# Patient Record
Sex: Female | Born: 1935 | Race: White | Hispanic: No | Smoking: Former smoker
Health system: Southern US, Community
[De-identification: ages and names within clinical notes are randomized; demographics above are authoritative.]

## PROBLEM LIST (undated history)

## (undated) DIAGNOSIS — J101 Influenza due to other identified influenza virus with other respiratory manifestations: Secondary | ICD-10-CM

## (undated) DIAGNOSIS — I10 Essential (primary) hypertension: Secondary | ICD-10-CM

## (undated) DIAGNOSIS — I639 Cerebral infarction, unspecified: Secondary | ICD-10-CM

## (undated) DIAGNOSIS — G20A1 Parkinson's disease without dyskinesia, without mention of fluctuations: Secondary | ICD-10-CM

## (undated) DIAGNOSIS — G20 Parkinson's disease: Secondary | ICD-10-CM

## (undated) DIAGNOSIS — E079 Disorder of thyroid, unspecified: Secondary | ICD-10-CM

## (undated) DIAGNOSIS — J449 Chronic obstructive pulmonary disease, unspecified: Secondary | ICD-10-CM

## (undated) DIAGNOSIS — E7800 Pure hypercholesterolemia, unspecified: Secondary | ICD-10-CM

## (undated) HISTORY — PX: CHOLECYSTECTOMY: SHX55

## (undated) HISTORY — PX: BACK SURGERY: SHX140

## (undated) HISTORY — PX: OTHER SURGICAL HISTORY: SHX169

---

## 2010-03-07 ENCOUNTER — Emergency Department (HOSPITAL_COMMUNITY): Admission: EM | Admit: 2010-03-07 | Discharge: 2010-03-07 | Payer: Self-pay | Admitting: Emergency Medicine

## 2015-08-26 ENCOUNTER — Encounter (HOSPITAL_COMMUNITY): Payer: Self-pay | Admitting: Emergency Medicine

## 2015-08-26 ENCOUNTER — Emergency Department (HOSPITAL_COMMUNITY)
Admission: EM | Admit: 2015-08-26 | Discharge: 2015-08-26 | Disposition: A | Discharge status: Home / Self Care | Payer: Medicare Other | Attending: Emergency Medicine | Admitting: Emergency Medicine

## 2015-08-26 DIAGNOSIS — Z7951 Long term (current) use of inhaled steroids: Secondary | ICD-10-CM | POA: Insufficient documentation

## 2015-08-26 DIAGNOSIS — L539 Erythematous condition, unspecified: Secondary | ICD-10-CM | POA: Insufficient documentation

## 2015-08-26 DIAGNOSIS — Z79899 Other long term (current) drug therapy: Secondary | ICD-10-CM | POA: Diagnosis not present

## 2015-08-26 DIAGNOSIS — E7800 Pure hypercholesterolemia, unspecified: Secondary | ICD-10-CM | POA: Insufficient documentation

## 2015-08-26 DIAGNOSIS — Z792 Long term (current) use of antibiotics: Secondary | ICD-10-CM | POA: Diagnosis not present

## 2015-08-26 DIAGNOSIS — N390 Urinary tract infection, site not specified: Secondary | ICD-10-CM | POA: Diagnosis not present

## 2015-08-26 DIAGNOSIS — G20 Parkinson's disease: Secondary | ICD-10-CM | POA: Diagnosis not present

## 2015-08-26 DIAGNOSIS — I10 Essential (primary) hypertension: Secondary | ICD-10-CM | POA: Diagnosis not present

## 2015-08-26 DIAGNOSIS — Z7982 Long term (current) use of aspirin: Secondary | ICD-10-CM | POA: Insufficient documentation

## 2015-08-26 DIAGNOSIS — E079 Disorder of thyroid, unspecified: Secondary | ICD-10-CM | POA: Insufficient documentation

## 2015-08-26 DIAGNOSIS — Z9889 Other specified postprocedural states: Secondary | ICD-10-CM | POA: Insufficient documentation

## 2015-08-26 DIAGNOSIS — Z8673 Personal history of transient ischemic attack (TIA), and cerebral infarction without residual deficits: Secondary | ICD-10-CM | POA: Diagnosis not present

## 2015-08-26 DIAGNOSIS — Z87891 Personal history of nicotine dependence: Secondary | ICD-10-CM | POA: Insufficient documentation

## 2015-08-26 DIAGNOSIS — R300 Dysuria: Secondary | ICD-10-CM | POA: Diagnosis present

## 2015-08-26 HISTORY — DX: Essential (primary) hypertension: I10

## 2015-08-26 HISTORY — DX: Cerebral infarction, unspecified: I63.9

## 2015-08-26 HISTORY — DX: Pure hypercholesterolemia, unspecified: E78.00

## 2015-08-26 HISTORY — DX: Parkinson's disease: G20

## 2015-08-26 HISTORY — DX: Disorder of thyroid, unspecified: E07.9

## 2015-08-26 HISTORY — DX: Parkinson's disease without dyskinesia, without mention of fluctuations: G20.A1

## 2015-08-26 LAB — BASIC METABOLIC PANEL
ANION GAP: 7 (ref 5–15)
BUN: 19 mg/dL (ref 6–20)
CALCIUM: 9.8 mg/dL (ref 8.9–10.3)
CO2: 26 mmol/L (ref 22–32)
CREATININE: 0.94 mg/dL (ref 0.44–1.00)
Chloride: 109 mmol/L (ref 101–111)
GFR, EST NON AFRICAN AMERICAN: 53 mL/min — AB (ref >60)
Glucose, Bld: 112 mg/dL — ABNORMAL HIGH (ref 65–99)
Potassium: 4 mmol/L (ref 3.5–5.1)
SODIUM: 142 mmol/L (ref 135–145)

## 2015-08-26 LAB — URINALYSIS, ROUTINE W REFLEX MICROSCOPIC
BILIRUBIN URINE: NEGATIVE
GLUCOSE, UA: NEGATIVE mg/dL
NITRITE: NEGATIVE
PROTEIN: 100 mg/dL — AB
Specific Gravity, Urine: >1.03 — ABNORMAL HIGH (ref 1.005–1.030)
pH: 5.5 (ref 5.0–8.0)

## 2015-08-26 LAB — CBC WITH DIFFERENTIAL/PLATELET
BASOS ABS: 0 10*3/uL (ref 0.0–0.1)
BASOS PCT: 0 %
EOS ABS: 0.2 10*3/uL (ref 0.0–0.7)
EOS PCT: 2 %
HEMATOCRIT: 38.3 % (ref 36.0–46.0)
Hemoglobin: 12.3 g/dL (ref 12.0–15.0)
Lymphocytes Relative: 16 %
Lymphs Abs: 1.3 10*3/uL (ref 0.7–4.0)
MCH: 29.8 pg (ref 26.0–34.0)
MCHC: 32.1 g/dL (ref 30.0–36.0)
MCV: 92.7 fL (ref 78.0–100.0)
MONO ABS: 0.5 10*3/uL (ref 0.1–1.0)
MONOS PCT: 6 %
NEUTROS ABS: 6.1 10*3/uL (ref 1.7–7.7)
Neutrophils Relative %: 76 %
PLATELETS: 170 10*3/uL (ref 150–400)
RBC: 4.13 MIL/uL (ref 3.87–5.11)
RDW: 15.1 % (ref 11.5–15.5)
WBC: 8.2 10*3/uL (ref 4.0–10.5)

## 2015-08-26 LAB — URINE MICROSCOPIC-ADD ON

## 2015-08-26 MED ORDER — CEPHALEXIN 500 MG PO CAPS: 500.0000 mg | ORAL_CAPSULE | Freq: Two times a day (BID) | ORAL | Status: DC

## 2015-08-26 MED ORDER — CEPHALEXIN 500 MG PO CAPS: 500.0000 mg | ORAL_CAPSULE | Freq: Four times a day (QID) | ORAL | Status: DC

## 2015-08-26 MED ORDER — CEPHALEXIN 500 MG PO CAPS
500.0000 mg | ORAL_CAPSULE | Freq: Once | ORAL | Status: AC
  Administered 2015-08-26: 500 mg via ORAL
  Filled 2015-08-26: qty 1

## 2015-08-26 NOTE — ED Notes (Signed)
Pt c/o burning with urination, malodorous urine and urinary frequency.

## 2015-08-26 NOTE — Discharge Instructions (Signed)

## 2015-08-26 NOTE — ED Provider Notes (Signed)
CSN: [id number]     Arrival date & time 08/26/15  1002 History   First MD Initiated Contact with Patient 08/26/15 1014     Chief Complaint  Patient presents with  . Urinary Tract Infection     (Consider location/radiation/quality/duration/timing/severity/associated sxs/prior Treatment) The history is provided by the patient and a relative.   Angelica Torres is a 81 y.o. female with a history of hypertension, Parkinson's and CVA presenting with dysuria and increased urinary frequency which started around 6 PM yesterday evening.  She lives in her daughter's home who is her caregiver.  PT uses a bedside commode and daughter has noted darker more pungent urine when and tearing the commode and patient endorses frequent urination with very small quantities of urine, persistent suprapubic pressure sensation and burning pain with urination.  She has had no nausea or emesis, no fevers or chills, no loss of appetite, stating she ate a full breakfast prior to arrival here today.  She does not have a history of UTIs.  She denies any other symptoms at this time.    Past Medical History  Diagnosis Date  . Hypertension   . Parkinson disease ([place])   . Thyroid disease   . High cholesterol   . Stroke [facility])    Past Surgical History  Procedure Laterality Date  . Back surgery    . Kidney suregery    . Cholecystectomy     No family history on file. Social History  Substance Use Topics  . Smoking status: Former Research scientist (life sciences)  . Smokeless tobacco: None  . Alcohol Use: No   OB History    No data available     Review of Systems  Constitutional: Negative for fever and chills.  HENT: Negative for congestion and sore throat.   Eyes: Negative.   Respiratory: Negative for chest tightness and shortness of breath.   Cardiovascular: Negative for chest pain.  Gastrointestinal: Negative for nausea, vomiting and abdominal pain.  Genitourinary: Positive for dysuria, urgency and frequency. Negative for hematuria.   Musculoskeletal: Negative for back pain, joint swelling, arthralgias and neck pain.  Skin: Negative.  Negative for rash and wound.  Neurological: Negative for dizziness, weakness, light-headedness, numbness and headaches.  Psychiatric/Behavioral: Negative.       Allergies  Sulfa antibiotics and Codeine  Home Medications   Prior to Admission medications   Medication Sig Start Date End Date Taking? Authorizing Provider  aspirin EC 325 MG tablet Take 325 mg by mouth daily.   Yes Historical Provider, MD  benazepril (LOTENSIN) 40 MG tablet Take 1 tablet by mouth daily. 08/21/15  Yes Historical Provider, MD  beta carotene w/minerals (OCUVITE) tablet Take 1 tablet by mouth daily.   Yes Historical Provider, MD  BREO ELLIPTA 100-25 MCG/INH AEPB Inhale 1 puff into the lungs 2 (two) times daily. 08/15/15  Yes Historical Provider, MD  Carbidopa-Levodopa ER (SINEMET CR) 25-100 MG tablet controlled release Take 1 tablet by mouth 3 (three) times daily. 08/21/15  Yes Historical Provider, MD  citalopram (CELEXA) 40 MG tablet Take 1 tablet by mouth daily. 08/21/15  Yes Historical Provider, MD  clonazePAM (KLONOPIN) 0.5 MG tablet Take 1 tablet by mouth 2 (two) times daily.  08/21/15  Yes Historical Provider, MD  docusate sodium (COLACE) 100 MG capsule Take 100 mg by mouth daily.   Yes Historical Provider, MD  Fish Oil-Cholecalciferol (FISH OIL + D3 PO) Take 1 capsule by mouth daily.   Yes Historical Provider, MD  folic acid (FOLVITE) 1 MG tablet  Take 1 mg by mouth daily.   Yes Historical Provider, MD  furosemide (LASIX) 20 MG tablet Take 10 tablets by mouth daily.  07/25/15  Yes Historical Provider, MD  levothyroxine (SYNTHROID, LEVOTHROID) 88 MCG tablet Take 1 tablet by mouth daily. 08/21/15  Yes Historical Provider, MD  Multiple Vitamin (MULTIVITAMIN WITH MINERALS) TABS tablet Take 1 tablet by mouth daily.   Yes Historical Provider, MD  mupirocin ointment (BACTROBAN) 2 % Apply 1 application topically  daily. 08/21/15  Yes Historical Provider, MD  pramipexole (MIRAPEX) 0.25 MG tablet Take 1 tablet by mouth 3 (three) times daily. 08/21/15  Yes Historical Provider, MD  simvastatin (ZOCOR) 20 MG tablet Take 1 tablet by mouth daily. 08/21/15  Yes Historical Provider, MD  VESICARE 5 MG tablet Take 1 tablet by mouth daily. 08/21/15  Yes Historical Provider, MD  cephALEXin (KEFLEX) 500 MG capsule Take 1 capsule (500 mg total) by mouth 2 (two) times daily. 08/26/15   Evalee Jefferson, PA-C   BP 168/88 mmHg  Pulse 82  Temp(Src) 99 F (37.2 C) (Oral)  Resp 20  Ht 5\' 4"  (1.626 m)  Wt 90.719 kg  BMI 34.31 kg/m2  SpO2 94% Physical Exam  Constitutional: She appears well-developed and well-nourished.  HENT:  Head: Normocephalic and atraumatic.  Eyes: Conjunctivae are normal.  Neck: Normal range of motion.  Cardiovascular: Normal rate, regular rhythm, normal heart sounds and intact distal pulses.   Pulmonary/Chest: Effort normal and breath sounds normal. She has no wheezes.  Abdominal: Soft. Bowel sounds are normal. There is tenderness in the suprapubic area. There is no rigidity, no rebound, no guarding and no CVA tenderness.  Mild suprapubic discomfort.  Musculoskeletal: Normal range of motion.  Neurological: She is alert.  Skin: Skin is warm and dry.  Scab left cheek with surrounding erythema (daughter endorses had a skin cryotherapy procedure at this site and is improving).  Psychiatric: She has a normal mood and affect.  Nursing note and vitals reviewed.   ED Course  Procedures (including critical care time) Labs Review Labs Reviewed  URINALYSIS, ROUTINE W REFLEX MICROSCOPIC (NOT AT [facility]) - Abnormal; Notable for the following:    APPearance CLOUDY (*)    Specific Gravity, Urine >1.030 (*)    Hgb urine dipstick LARGE (*)    Ketones, ur TRACE (*)    Protein, ur 100 (*)    Leukocytes, UA MODERATE (*)    All other components within normal limits  BASIC METABOLIC PANEL - Abnormal; Notable  for the following:    Glucose, Bld 112 (*)    GFR calc non Af Amer 53 (*)    All other components within normal limits  URINE MICROSCOPIC-ADD ON - Abnormal; Notable for the following:    Squamous Epithelial / LPF 6-30 (*)    Bacteria, UA FEW (*)    All other components within normal limits  URINE CULTURE  CBC WITH DIFFERENTIAL/PLATELET      EKG Interpretation None      MDM   Final diagnoses:  UTI (lower urinary tract infection)    Patients labs reviewed.    Results were also discussed with patient and family.   She was prescribed keflex, first dose given here.  Urine cx ordered.  Plan f/u with pcp or return here for any worsened sx including fever, nausea, vomiting, flank pain.    Pt seen by Dr Wyvonnia Dusky prior to dc home.     Evalee Jefferson, PA-C 08/26/15 1736  Ezequiel Essex, MD 08/26/15 Vernelle Emerald

## 2015-08-28 LAB — URINE CULTURE

## 2015-08-29 ENCOUNTER — Telehealth (HOSPITAL_COMMUNITY): Payer: Self-pay

## 2015-08-29 NOTE — Telephone Encounter (Signed)
Post ED Visit - Positive Culture Follow-up  Culture report reviewed by antimicrobial stewardship pharmacist:  [x]  Elenor Quinones, Pharm.D. []  Heide Guile, Pharm.D., BCPS []  Parks Neptune, Pharm.D. []  Alycia Rossetti, Pharm.D., BCPS []  [place], Pharm.D., BCPS, AAHIVP []  Legrand Como, Pharm.D., BCPS, AAHIVP []  Milus Glazier, Pharm.D. []  Rob Evette Doffing, Pharm.D.  Positive urine culture, >/= 100,000 colonies -> E Coli Treated with Cephalexin, organism sensitive to the same and no further patient follow-up is required at this time.  Dortha Kern 08/29/2015, 9:55 AM

## 2015-10-10 DIAGNOSIS — J101 Influenza due to other identified influenza virus with other respiratory manifestations: Secondary | ICD-10-CM

## 2015-10-10 HISTORY — DX: Influenza due to other identified influenza virus with other respiratory manifestations: J10.1

## 2015-11-03 ENCOUNTER — Observation Stay (HOSPITAL_COMMUNITY)
Admission: EM | Admit: 2015-11-03 | Discharge: 2015-11-07 | Disposition: A | Discharge status: Skilled Nursing Facility | Payer: Medicare Other | Attending: Internal Medicine | Admitting: Internal Medicine

## 2015-11-03 ENCOUNTER — Encounter (HOSPITAL_COMMUNITY): Payer: Self-pay | Admitting: Emergency Medicine

## 2015-11-03 ENCOUNTER — Emergency Department (HOSPITAL_COMMUNITY): Payer: Medicare Other

## 2015-11-03 DIAGNOSIS — F32A Depression, unspecified: Secondary | ICD-10-CM | POA: Diagnosis present

## 2015-11-03 DIAGNOSIS — I69354 Hemiplegia and hemiparesis following cerebral infarction affecting left non-dominant side: Secondary | ICD-10-CM | POA: Insufficient documentation

## 2015-11-03 DIAGNOSIS — I517 Cardiomegaly: Secondary | ICD-10-CM | POA: Insufficient documentation

## 2015-11-03 DIAGNOSIS — J101 Influenza due to other identified influenza virus with other respiratory manifestations: Secondary | ICD-10-CM | POA: Diagnosis not present

## 2015-11-03 DIAGNOSIS — F418 Other specified anxiety disorders: Secondary | ICD-10-CM

## 2015-11-03 DIAGNOSIS — J111 Influenza due to unidentified influenza virus with other respiratory manifestations: Secondary | ICD-10-CM | POA: Diagnosis not present

## 2015-11-03 DIAGNOSIS — F329 Major depressive disorder, single episode, unspecified: Secondary | ICD-10-CM | POA: Insufficient documentation

## 2015-11-03 DIAGNOSIS — Z885 Allergy status to narcotic agent status: Secondary | ICD-10-CM | POA: Diagnosis not present

## 2015-11-03 DIAGNOSIS — G20A1 Parkinson's disease without dyskinesia, without mention of fluctuations: Secondary | ICD-10-CM | POA: Diagnosis present

## 2015-11-03 DIAGNOSIS — F419 Anxiety disorder, unspecified: Secondary | ICD-10-CM | POA: Diagnosis present

## 2015-11-03 DIAGNOSIS — Z7982 Long term (current) use of aspirin: Secondary | ICD-10-CM | POA: Diagnosis not present

## 2015-11-03 DIAGNOSIS — J40 Bronchitis, not specified as acute or chronic: Secondary | ICD-10-CM | POA: Diagnosis not present

## 2015-11-03 DIAGNOSIS — E7800 Pure hypercholesterolemia, unspecified: Secondary | ICD-10-CM | POA: Insufficient documentation

## 2015-11-03 DIAGNOSIS — I10 Essential (primary) hypertension: Secondary | ICD-10-CM | POA: Diagnosis present

## 2015-11-03 DIAGNOSIS — Z87891 Personal history of nicotine dependence: Secondary | ICD-10-CM | POA: Diagnosis not present

## 2015-11-03 DIAGNOSIS — R0602 Shortness of breath: Secondary | ICD-10-CM | POA: Diagnosis present

## 2015-11-03 DIAGNOSIS — R197 Diarrhea, unspecified: Secondary | ICD-10-CM | POA: Insufficient documentation

## 2015-11-03 DIAGNOSIS — E079 Disorder of thyroid, unspecified: Secondary | ICD-10-CM | POA: Diagnosis not present

## 2015-11-03 DIAGNOSIS — E785 Hyperlipidemia, unspecified: Secondary | ICD-10-CM | POA: Diagnosis present

## 2015-11-03 DIAGNOSIS — Z882 Allergy status to sulfonamides status: Secondary | ICD-10-CM | POA: Insufficient documentation

## 2015-11-03 DIAGNOSIS — G20 Parkinson's disease: Secondary | ICD-10-CM | POA: Insufficient documentation

## 2015-11-03 DIAGNOSIS — Z8673 Personal history of transient ischemic attack (TIA), and cerebral infarction without residual deficits: Secondary | ICD-10-CM

## 2015-11-03 DIAGNOSIS — R69 Illness, unspecified: Secondary | ICD-10-CM

## 2015-11-03 LAB — CBC WITH DIFFERENTIAL/PLATELET
Basophils Absolute: 0 10*3/uL (ref 0.0–0.1)
Basophils Relative: 0 %
Eosinophils Absolute: 0 10*3/uL (ref 0.0–0.7)
Eosinophils Relative: 0 %
HEMATOCRIT: 38.8 % (ref 36.0–46.0)
HEMOGLOBIN: 12.4 g/dL (ref 12.0–15.0)
LYMPHS ABS: 0.6 10*3/uL — AB (ref 0.7–4.0)
LYMPHS PCT: 10 %
MCH: 29.3 pg (ref 26.0–34.0)
MCHC: 32 g/dL (ref 30.0–36.0)
MCV: 91.7 fL (ref 78.0–100.0)
MONOS PCT: 9 %
Monocytes Absolute: 0.6 10*3/uL (ref 0.1–1.0)
NEUTROS ABS: 5.1 10*3/uL (ref 1.7–7.7)
NEUTROS PCT: 81 %
Platelets: 152 10*3/uL (ref 150–400)
RBC: 4.23 MIL/uL (ref 3.87–5.11)
RDW: 15.1 % (ref 11.5–15.5)
WBC: 6.3 10*3/uL (ref 4.0–10.5)

## 2015-11-03 LAB — BASIC METABOLIC PANEL
Anion gap: 10 (ref 5–15)
BUN: 19 mg/dL (ref 6–20)
CHLORIDE: 106 mmol/L (ref 101–111)
CO2: 25 mmol/L (ref 22–32)
CREATININE: 0.93 mg/dL (ref 0.44–1.00)
Calcium: 9.6 mg/dL (ref 8.9–10.3)
GFR calc Af Amer: >60 mL/min (ref >60)
GFR calc non Af Amer: 54 mL/min — ABNORMAL LOW (ref >60)
GLUCOSE: 107 mg/dL — AB (ref 65–99)
POTASSIUM: 4.1 mmol/L (ref 3.5–5.1)
SODIUM: 141 mmol/L (ref 135–145)

## 2015-11-03 LAB — URINALYSIS, ROUTINE W REFLEX MICROSCOPIC
Bilirubin Urine: NEGATIVE
Glucose, UA: NEGATIVE mg/dL
KETONES UR: NEGATIVE mg/dL
LEUKOCYTES UA: NEGATIVE
NITRITE: NEGATIVE
PH: 5.5 (ref 5.0–8.0)
Specific Gravity, Urine: >1.03 — ABNORMAL HIGH (ref 1.005–1.030)

## 2015-11-03 LAB — INFLUENZA PANEL BY PCR (TYPE A & B)
H1N1FLUPCR: NOT DETECTED
INFLAPCR: POSITIVE — AB
INFLBPCR: NEGATIVE

## 2015-11-03 LAB — HEPATIC FUNCTION PANEL
ALK PHOS: 51 U/L (ref 38–126)
ALT: 27 U/L (ref 14–54)
AST: 44 U/L — ABNORMAL HIGH (ref 15–41)
Albumin: 3.7 g/dL (ref 3.5–5.0)
BILIRUBIN TOTAL: 0.4 mg/dL (ref 0.3–1.2)
Total Protein: 7.3 g/dL (ref 6.5–8.1)

## 2015-11-03 LAB — BRAIN NATRIURETIC PEPTIDE: B NATRIURETIC PEPTIDE 5: 78 pg/mL (ref 0.0–100.0)

## 2015-11-03 LAB — MAGNESIUM: MAGNESIUM: 2.1 mg/dL (ref 1.7–2.4)

## 2015-11-03 LAB — URINE MICROSCOPIC-ADD ON

## 2015-11-03 LAB — TROPONIN I: Troponin I: <0.03 ng/mL (ref <0.031)

## 2015-11-03 LAB — TSH: TSH: 1.973 u[IU]/mL (ref 0.350–4.500)

## 2015-11-03 MED ORDER — SODIUM CHLORIDE 0.9 % IV SOLN
Freq: Once | INTRAVENOUS | Status: AC
  Administered 2015-11-03: 15:00:00 via INTRAVENOUS

## 2015-11-03 MED ORDER — FOLIC ACID 1 MG PO TABS
1.0000 mg | ORAL_TABLET | Freq: Every day | ORAL | Status: DC
  Administered 2015-11-03 – 2015-11-06 (×4): 1 mg via ORAL
  Filled 2015-11-03 (×4): qty 1

## 2015-11-03 MED ORDER — MECLIZINE HCL 12.5 MG PO TABS: 12.5000 mg | ORAL_TABLET | Freq: Three times a day (TID) | ORAL | Status: DC | PRN

## 2015-11-03 MED ORDER — BUDESONIDE 0.25 MG/2ML IN SUSP
0.2500 mg | Freq: Two times a day (BID) | RESPIRATORY_TRACT | Status: DC
  Administered 2015-11-03 – 2015-11-06 (×7): 0.25 mg via RESPIRATORY_TRACT
  Filled 2015-11-03 (×7): qty 2

## 2015-11-03 MED ORDER — ACETAMINOPHEN 650 MG RE SUPP: 650.0000 mg | Freq: Four times a day (QID) | RECTAL | Status: DC | PRN

## 2015-11-03 MED ORDER — PREDNISONE 50 MG PO TABS
60.0000 mg | ORAL_TABLET | Freq: Once | ORAL | Status: AC
  Administered 2015-11-03: 60 mg via ORAL
  Filled 2015-11-03: qty 1

## 2015-11-03 MED ORDER — ACETAMINOPHEN 325 MG PO TABS: 650.0000 mg | ORAL_TABLET | Freq: Four times a day (QID) | ORAL | Status: DC | PRN

## 2015-11-03 MED ORDER — GUAIFENESIN ER 600 MG PO TB12
600.0000 mg | ORAL_TABLET | Freq: Two times a day (BID) | ORAL | Status: DC
Start: 2015-11-03 — End: 2015-11-07
  Administered 2015-11-03 – 2015-11-06 (×7): 600 mg via ORAL
  Filled 2015-11-03 (×7): qty 1

## 2015-11-03 MED ORDER — PRAMIPEXOLE DIHYDROCHLORIDE 0.25 MG PO TABS
0.2500 mg | ORAL_TABLET | Freq: Three times a day (TID) | ORAL | Status: DC
  Administered 2015-11-03 – 2015-11-06 (×8): 0.25 mg via ORAL
  Filled 2015-11-03 (×11): qty 1

## 2015-11-03 MED ORDER — ALBUTEROL SULFATE (2.5 MG/3ML) 0.083% IN NEBU: 2.5000 mg | INHALATION_SOLUTION | RESPIRATORY_TRACT | Status: DC | PRN

## 2015-11-03 MED ORDER — CLONAZEPAM 0.5 MG PO TABS
0.5000 mg | ORAL_TABLET | Freq: Two times a day (BID) | ORAL | Status: DC
  Administered 2015-11-03 – 2015-11-06 (×7): 0.5 mg via ORAL
  Filled 2015-11-03 (×7): qty 1

## 2015-11-03 MED ORDER — ASPIRIN EC 325 MG PO TBEC
325.0000 mg | DELAYED_RELEASE_TABLET | Freq: Every day | ORAL | Status: DC
  Administered 2015-11-03 – 2015-11-06 (×4): 325 mg via ORAL
  Filled 2015-11-03 (×4): qty 1

## 2015-11-03 MED ORDER — OSELTAMIVIR PHOSPHATE 30 MG PO CAPS
30.0000 mg | ORAL_CAPSULE | Freq: Once | ORAL | Status: AC
  Administered 2015-11-03: 30 mg via ORAL
  Filled 2015-11-03: qty 1

## 2015-11-03 MED ORDER — SODIUM CHLORIDE 0.9 % IV SOLN: INTRAVENOUS | Status: DC

## 2015-11-03 MED ORDER — DOCUSATE SODIUM 100 MG PO CAPS
100.0000 mg | ORAL_CAPSULE | Freq: Every day | ORAL | Status: DC
  Administered 2015-11-03 – 2015-11-06 (×4): 100 mg via ORAL
  Filled 2015-11-03 (×4): qty 1

## 2015-11-03 MED ORDER — ONDANSETRON HCL 4 MG PO TABS: 4.0000 mg | ORAL_TABLET | Freq: Four times a day (QID) | ORAL | Status: DC | PRN

## 2015-11-03 MED ORDER — IPRATROPIUM-ALBUTEROL 0.5-2.5 (3) MG/3ML IN SOLN
3.0000 mL | Freq: Once | RESPIRATORY_TRACT | Status: AC
  Administered 2015-11-03: 3 mL via RESPIRATORY_TRACT
  Filled 2015-11-03: qty 3

## 2015-11-03 MED ORDER — HEPARIN SODIUM (PORCINE) 5000 UNIT/ML IJ SOLN
5000.0000 [IU] | Freq: Three times a day (TID) | INTRAMUSCULAR | Status: DC
  Administered 2015-11-03 – 2015-11-07 (×10): 5000 [IU] via SUBCUTANEOUS
  Filled 2015-11-03 (×11): qty 1

## 2015-11-03 MED ORDER — HYDROCODONE-ACETAMINOPHEN 5-325 MG PO TABS: 1.0000 | ORAL_TABLET | Freq: Four times a day (QID) | ORAL | Status: DC | PRN

## 2015-11-03 MED ORDER — DARIFENACIN HYDROBROMIDE ER 7.5 MG PO TB24
7.5000 mg | ORAL_TABLET | Freq: Every day | ORAL | Status: DC
  Administered 2015-11-03 – 2015-11-06 (×4): 7.5 mg via ORAL
  Filled 2015-11-03 (×4): qty 1

## 2015-11-03 MED ORDER — OSELTAMIVIR PHOSPHATE 75 MG PO CAPS
75.0000 mg | ORAL_CAPSULE | Freq: Two times a day (BID) | ORAL | Status: DC
  Administered 2015-11-03: 75 mg via ORAL
  Filled 2015-11-03: qty 1

## 2015-11-03 MED ORDER — SIMVASTATIN 20 MG PO TABS
20.0000 mg | ORAL_TABLET | Freq: Every day | ORAL | Status: DC
  Administered 2015-11-03 – 2015-11-06 (×4): 20 mg via ORAL
  Filled 2015-11-03 (×4): qty 1

## 2015-11-03 MED ORDER — CARBIDOPA-LEVODOPA ER 25-100 MG PO TBCR
1.0000 | EXTENDED_RELEASE_TABLET | Freq: Three times a day (TID) | ORAL | Status: DC
  Administered 2015-11-03 – 2015-11-06 (×9): 1 via ORAL
  Filled 2015-11-03 (×10): qty 1

## 2015-11-03 MED ORDER — PREDNISONE 20 MG PO TABS
40.0000 mg | ORAL_TABLET | Freq: Two times a day (BID) | ORAL | Status: DC
  Administered 2015-11-04 – 2015-11-06 (×5): 40 mg via ORAL
  Filled 2015-11-03 (×5): qty 2

## 2015-11-03 MED ORDER — OCUVITE PO TABS
1.0000 | ORAL_TABLET | Freq: Every day | ORAL | Status: DC
  Administered 2015-11-04 – 2015-11-05 (×2): 1 via ORAL
  Filled 2015-11-03 (×5): qty 1

## 2015-11-03 MED ORDER — LEVOTHYROXINE SODIUM 88 MCG PO TABS
88.0000 ug | ORAL_TABLET | Freq: Every day | ORAL | Status: DC
  Administered 2015-11-04 – 2015-11-06 (×3): 88 ug via ORAL
  Filled 2015-11-03 (×3): qty 1

## 2015-11-03 MED ORDER — CITALOPRAM HYDROBROMIDE 20 MG PO TABS
40.0000 mg | ORAL_TABLET | Freq: Every day | ORAL | Status: DC
  Administered 2015-11-03 – 2015-11-06 (×4): 40 mg via ORAL
  Filled 2015-11-03 (×4): qty 2

## 2015-11-03 MED ORDER — BENAZEPRIL HCL 10 MG PO TABS
40.0000 mg | ORAL_TABLET | Freq: Every day | ORAL | Status: DC
  Administered 2015-11-03 – 2015-11-06 (×4): 40 mg via ORAL
  Filled 2015-11-03 (×4): qty 4

## 2015-11-03 MED ORDER — ONDANSETRON HCL 4 MG/2ML IJ SOLN
4.0000 mg | Freq: Four times a day (QID) | INTRAMUSCULAR | Status: DC | PRN
  Administered 2015-11-04 – 2015-11-05 (×2): 4 mg via INTRAVENOUS
  Filled 2015-11-03 (×2): qty 2

## 2015-11-03 MED ORDER — DOXYCYCLINE HYCLATE 100 MG PO TABS
100.0000 mg | ORAL_TABLET | Freq: Two times a day (BID) | ORAL | Status: DC
  Administered 2015-11-03 – 2015-11-06 (×6): 100 mg via ORAL
  Filled 2015-11-03 (×7): qty 1

## 2015-11-03 MED ORDER — SODIUM CHLORIDE 0.9 % IV SOLN
Freq: Once | INTRAVENOUS | Status: AC
Start: 2015-11-03 — End: 2015-11-04
  Administered 2015-11-03: 20:00:00 via INTRAVENOUS

## 2015-11-03 MED ORDER — ADULT MULTIVITAMIN W/MINERALS CH
1.0000 | ORAL_TABLET | Freq: Every day | ORAL | Status: DC
  Administered 2015-11-03 – 2015-11-06 (×4): 1 via ORAL
  Filled 2015-11-03 (×4): qty 1

## 2015-11-03 NOTE — H&P (Signed)
[facility] History and Physical  [name] [id number] DOB: [birth year] DOA: 11/03/2015  Referring physician: Dr. Wyvonnia Dusky  PCP: Glenda Chroman., MD   Chief Complaint: Shortness of breath, general malaise, productive cough and wheezing  HPI: Angelica Torres is a 82 y.o. female with a past medical history significant for hypertension, hyperlipidemia, but isn't disease, history of stroke (with left hemiplegia), thyroid disease and depression/anxiety; who presented to the hospital secondary to shortness of breath, general malaise, productive cough and fever. Patient reports that her symptoms have been present for the last 36-48 hours and worsening. She was seen by her primary care physician who diagnosed her with upper respiratory infection and bronchitis and a start the patient on augmentin, patient didn't tolerate medication well and ended up throwing up at home. Given continue worsening of her symptoms she presented to the emergency department for further evaluation and treatment. In the ED she was found to be mildly tachypneic with a low-grade temperature; chest x-ray with bronchitic changes but no acute infiltrates. Normal WBCs. Influenza by PCR was done and came back to be positive for influenza A. [facility] has been called to admit the patient for further evaluation and treatment.  Of note, patient with ongoing wheezing and rhonchi. Denies chest pain, orthopnea, abdominal pain, dysuria, melena, hematochezia, hematemesis, hemoptysis, hematuria and any other complaints.    Review of Systems:  Negative except as otherwise mentioned in history of present illness.  Past Medical History  Diagnosis Date  . Hypertension   . Parkinson disease ([place])   . Thyroid disease   . High cholesterol   . Stroke [facility])    Past Surgical History  Procedure Laterality Date  . Back surgery    . Kidney suregery    . Cholecystectomy     Social History:  reports that she has quit smoking.  She quit smokeless tobacco use about 51 years ago. She reports that she does not drink alcohol or use illicit drugs.  Allergies  Allergen Reactions  . Sulfa Antibiotics Other (See Comments)    Reaction unknown to patient  . Codeine Rash   Family history: positive for hypertension and heart problems. Otherwise noncontributory according to patient  Prior to Admission medications   Medication Sig Start Date End Date Taking? Authorizing Provider  amoxicillin-clavulanate (AUGMENTIN) 875-125 MG tablet Take 1 tablet by mouth 2 (two) times daily. Starting 10/29/2015 x 10 days. 11/02/15  Yes Historical Provider, MD  aspirin EC 325 MG tablet Take 325 mg by mouth 2 (two) times daily.    Yes Historical Provider, MD  benazepril (LOTENSIN) 40 MG tablet Take 1 tablet by mouth daily. 08/21/15  Yes Historical Provider, MD  beta carotene w/minerals (OCUVITE) tablet Take 1 tablet by mouth daily.   Yes Historical Provider, MD  BREO ELLIPTA 100-25 MCG/INH AEPB Inhale 1 puff into the lungs 2 (two) times daily. 08/15/15  Yes Historical Provider, MD  Carbidopa-Levodopa ER (SINEMET CR) 25-100 MG tablet controlled release Take 1 tablet by mouth 3 (three) times daily. 08/21/15  Yes Historical Provider, MD  citalopram (CELEXA) 40 MG tablet Take 1 tablet by mouth daily. 08/21/15  Yes Historical Provider, MD  clonazePAM (KLONOPIN) 0.5 MG tablet Take 1 tablet by mouth 2 (two) times daily.  08/21/15  Yes Historical Provider, MD  docusate sodium (COLACE) 100 MG capsule Take 100 mg by mouth daily.   Yes Historical Provider, MD  Fish Oil-Cholecalciferol (FISH OIL + D3 PO) Take 1 capsule by mouth daily.   Yes Historical Provider,  MD  folic acid (FOLVITE) 1 MG tablet Take 1 mg by mouth daily.   Yes Historical Provider, MD  furosemide (LASIX) 20 MG tablet Take 10 tablets by mouth daily.  07/25/15  Yes Historical Provider, MD  HYDROcodone-acetaminophen (NORCO/VICODIN) 5-325 MG tablet Take 1 tablet by mouth 2 (two) times daily as  needed for moderate pain.  09/14/15  Yes Historical Provider, MD  levothyroxine (SYNTHROID, LEVOTHROID) 88 MCG tablet Take 1 tablet by mouth daily. 08/21/15  Yes Historical Provider, MD  meclizine (ANTIVERT) 12.5 MG tablet Take 1 tablet by mouth 3 (three) times daily as needed for dizziness.  10/15/15  Yes Historical Provider, MD  Multiple Vitamin (MULTIVITAMIN WITH MINERALS) TABS tablet Take 1 tablet by mouth daily.   Yes Historical Provider, MD  pramipexole (MIRAPEX) 0.25 MG tablet Take 1 tablet by mouth 3 (three) times daily. 08/21/15  Yes Historical Provider, MD  simvastatin (ZOCOR) 20 MG tablet Take 1 tablet by mouth daily. 08/21/15  Yes Historical Provider, MD  VESICARE 5 MG tablet Take 1 tablet by mouth daily. 08/21/15  Yes Historical Provider, MD  cephALEXin (KEFLEX) 500 MG capsule Take 1 capsule (500 mg total) by mouth 2 (two) times daily. Patient not taking: Reported on 11/03/2015 08/26/15   Evalee Jefferson, PA-C   Physical Exam: Filed Vitals:   11/03/15 1630 11/03/15 1645 11/03/15 1730 11/03/15 1830  BP: 124/70  172/86 148/60  Pulse: 100 95 99 91  Temp:    99.1 F (37.3 C)  TempSrc:    Oral  Resp: 27 21 23 16   Height:      Weight:      SpO2: 91% 91% 92% 91%    Wt Readings from Last 3 Encounters:  11/03/15 93.441 kg (206 lb)  08/26/15 90.719 kg (200 lb)    General:  Appears calm; mild distress secondary to generalized weakness and shortness of breath with ongoing cough. Per family members febrile while at home. Currently without any chest pain, no nausea and no vomiting. Eyes: PERRL, normal lids, irises & conjunctiva, no icterus, no nystagmus ENT: Moderate hearing impairment, mild dryness of mucous membranes, no erythema, no exudates, no drainage out of her years or nostrils Neck: no LAD, masses or thyromegaly; no JVD Cardiovascular: RRR, no m/r/g. S1 and S2 Respiratory: Diffuse rhonchi, positive expiratory wheezing, fair air movement. Abdomen: soft, nt, nd, positive bowel  sounds Skin: no rash or induration seen on limited exam Musculoskeletal: grossly normal tone BUE/BLE, 1-2+ edema bilaterally (chronic and unchanged according to family members; due to venous insufficiency) Psychiatric: grossly normal mood and affect, speech fluent and appropriate Neurologic: Patient with left hemiplegia from prior stroke, on change and without any new focal deficit.           Labs on Admission:  Basic Metabolic Panel:  Recent Labs Lab 11/03/15 1124  NA 141  K 4.1  CL 106  CO2 25  GLUCOSE 107*  BUN 19  CREATININE 0.93  CALCIUM 9.6   CBC:  Recent Labs Lab 11/03/15 1124  WBC 6.3  NEUTROABS 5.1  HGB 12.4  HCT 38.8  MCV 91.7  PLT 152   Cardiac Enzymes:  Recent Labs Lab 11/03/15 1124  TROPONINI <0.03    BNP (last 3 results)  Recent Labs  11/03/15 1124  BNP 78.0   Radiological Exams on Admission: Dg Chest 2 View  11/03/2015  CLINICAL DATA:  Shortness of breath, wheezing today EXAM: CHEST  2 VIEW COMPARISON:  02/09/2014 FINDINGS: Mild cardiomegaly. No confluent airspace opacities.  No effusions or acute bony abnormality. IMPRESSION: Cardiomegaly.  No active disease. Electronically Signed   By: Rolm Baptise M.D.   On: 11/03/2015 11:42    EKG:  No evidence of acute ischemic changes, sinus rhythm and rate controlled. Right bundle branch block appreciated  Assessment/Plan 1-shortness of breath, productive cough and fever: Secondary to Influenza A with respiratory manifestations. -Patient will be admitted to the hospital for fluid resuscitation, supportive care, Tamiflu, nebulizer treatments and use of doxycycline for bronchitis along with influenza. -Oxygen supplementation as needed, use of flutter valve and prednisone by mouth twice a day. -Will follow clinical response  2-Bronchitis: -As mentioned above will provide supportive care and doxycycline twice a day  3-Thyroid disease: Will continue the use of Synthroid and will check  TSH.  4-History of stroke: -No new deficit appreciated. -Will continue the use of aspirin for secondary prevention -Will ask physical therapy for evaluation and recommendations -Continue risk factors modifications  5-Essential hypertension: Soft but stable -Will hold diuretics overnight and provide gentle resuscitation -Heart healthy diet order -Continue benazepril  6-Hyperlipidemia: -Continue statins  7-Anxiety and depression: -Stable -No suicide ideation or hallucinations -Continue the use of Celexa and Klonopin.  8-Parkinson disease ([place]): -Continue Sinemet   Code Status: Full code DVT Prophylaxis: Heparin Family Communication: Daughter and son at bedside Disposition Plan: Observation, MedSurg bed; LOS expected to be less than 2 midnights  Time spent: 76 minutes  Barton Dubois [facility] Pager [phone]

## 2015-11-03 NOTE — ED Provider Notes (Signed)
CSN: [id number]     Arrival date & time 11/03/15  1034 History  By signing my name below, I, Altamease Oiler, attest that this documentation has been prepared under the direction and in the presence of Ezequiel Essex, MD. Electronically Signed: Altamease Oiler, ED Scribe. 11/03/2015. 10:56 AM   Chief Complaint  Patient presents with  . Fever  . Cough    The history is provided by the patient and a relative. No language interpreter was used.   Angelica Torres is a 82 y.o. female with history of stroke, HTN, high cholesterol, and parkinson's disease who presents to the Emergency Department with her son complaining of productive cough with onset 2 days ago. Pt was seen by her PCP yesterday where she was diagnosed with an URI and started on amoxicillin. She took a dose last night but was unable to hold it down. Associated symptoms include increased generalized weakness (unable to stand and transfer from her wheelchair as usual), fever, and sore throat. Pt denies chest pain and abdominal pain. Pt did have a flu shot this year. She is a former smoker (quit in 1967). No history of asthma, COPD, or cardiac disease. She does not wear oxygen at home.   Past Medical History  Diagnosis Date  . Hypertension   . Parkinson disease ([place])   . Thyroid disease   . High cholesterol   . Stroke [facility])    Past Surgical History  Procedure Laterality Date  . Back surgery    . Kidney suregery    . Cholecystectomy     History reviewed. No pertinent family history. Social History  Substance Use Topics  . Smoking status: Former Research scientist (life sciences)  . Smokeless tobacco: None  . Alcohol Use: No   OB History    No data available     Review of Systems  10 Systems reviewed and all are negative for acute change except as noted in the HPI.  Allergies  Sulfa antibiotics and Codeine  Home Medications   Prior to Admission medications   Medication Sig Start Date End Date Taking? Authorizing Provider   amoxicillin-clavulanate (AUGMENTIN) 875-125 MG tablet Take 1 tablet by mouth 2 (two) times daily. Starting 10/29/2015 x 10 days. 11/02/15  Yes Historical Provider, MD  aspirin EC 325 MG tablet Take 325 mg by mouth 2 (two) times daily.    Yes Historical Provider, MD  benazepril (LOTENSIN) 40 MG tablet Take 1 tablet by mouth daily. 08/21/15  Yes Historical Provider, MD  beta carotene w/minerals (OCUVITE) tablet Take 1 tablet by mouth daily.   Yes Historical Provider, MD  BREO ELLIPTA 100-25 MCG/INH AEPB Inhale 1 puff into the lungs 2 (two) times daily. 08/15/15  Yes Historical Provider, MD  Carbidopa-Levodopa ER (SINEMET CR) 25-100 MG tablet controlled release Take 1 tablet by mouth 3 (three) times daily. 08/21/15  Yes Historical Provider, MD  citalopram (CELEXA) 40 MG tablet Take 1 tablet by mouth daily. 08/21/15  Yes Historical Provider, MD  clonazePAM (KLONOPIN) 0.5 MG tablet Take 1 tablet by mouth 2 (two) times daily.  08/21/15  Yes Historical Provider, MD  docusate sodium (COLACE) 100 MG capsule Take 100 mg by mouth daily.   Yes Historical Provider, MD  Fish Oil-Cholecalciferol (FISH OIL + D3 PO) Take 1 capsule by mouth daily.   Yes Historical Provider, MD  folic acid (FOLVITE) 1 MG tablet Take 1 mg by mouth daily.   Yes Historical Provider, MD  furosemide (LASIX) 20 MG tablet Take 10 tablets by mouth daily.  07/25/15  Yes Historical Provider, MD  HYDROcodone-acetaminophen (NORCO/VICODIN) 5-325 MG tablet Take 1 tablet by mouth 2 (two) times daily as needed for moderate pain.  09/14/15  Yes Historical Provider, MD  levothyroxine (SYNTHROID, LEVOTHROID) 88 MCG tablet Take 1 tablet by mouth daily. 08/21/15  Yes Historical Provider, MD  meclizine (ANTIVERT) 12.5 MG tablet Take 1 tablet by mouth 3 (three) times daily as needed for dizziness.  10/15/15  Yes Historical Provider, MD  Multiple Vitamin (MULTIVITAMIN WITH MINERALS) TABS tablet Take 1 tablet by mouth daily.   Yes Historical Provider, MD   pramipexole (MIRAPEX) 0.25 MG tablet Take 1 tablet by mouth 3 (three) times daily. 08/21/15  Yes Historical Provider, MD  simvastatin (ZOCOR) 20 MG tablet Take 1 tablet by mouth daily. 08/21/15  Yes Historical Provider, MD  VESICARE 5 MG tablet Take 1 tablet by mouth daily. 08/21/15  Yes Historical Provider, MD  cephALEXin (KEFLEX) 500 MG capsule Take 1 capsule (500 mg total) by mouth 2 (two) times daily. Patient not taking: Reported on 11/03/2015 08/26/15   Evalee Jefferson, PA-C   BP 160/71 mmHg  Pulse 97  Temp(Src) 100.4 F (38 C) (Oral)  Resp 18  Ht 5\' 4"  (1.626 m)  Wt 206 lb (93.441 kg)  BMI 35.34 kg/m2  SpO2 91% Physical Exam  Constitutional: She is oriented to person, place, and time. She appears well-developed and well-nourished. No distress.  HENT:  Head: Normocephalic and atraumatic.  Mouth/Throat: Oropharynx is clear and moist. No oropharyngeal exudate.  Eyes: Conjunctivae and EOM are normal. Pupils are equal, round, and reactive to light.  Neck: Normal range of motion. Neck supple.  No meningismus.  Cardiovascular: Normal rate, regular rhythm, normal heart sounds and intact distal pulses.   No murmur heard. Pulmonary/Chest: Effort normal. No respiratory distress. She has wheezes. She exhibits no tenderness.  Dyspneic with conversation Diffuse wheezing with moderate air exchange  Abdominal: Soft. There is no tenderness. There is no rebound and no guarding.  Musculoskeletal: Normal range of motion. She exhibits edema. She exhibits no tenderness.  Trace pedal edema  Neurological: She is alert and oriented to person, place, and time. No cranial nerve deficit. She exhibits normal muscle tone. Coordination normal.  5/5 strength throughout. Left facial droop at baseline per son. Unable to stand or ambulate  Skin: Skin is warm.  Psychiatric: She has a normal mood and affect. Her behavior is normal.  Nursing note and vitals reviewed.   ED Course  Procedures (including critical  care time)  COORDINATION OF CARE: 10:51 AM Discussed treatment plan which includes lab work, CXR, EKG, and a breathing treatment with pt at bedside and pt agreed to plan.  Labs Review Labs Reviewed  CBC WITH DIFFERENTIAL/PLATELET - Abnormal; Notable for the following:    Lymphs Abs 0.6 (*)    All other components within normal limits  BASIC METABOLIC PANEL - Abnormal; Notable for the following:    Glucose, Bld 107 (*)    GFR calc non Af Amer 54 (*)    All other components within normal limits  URINALYSIS, ROUTINE W REFLEX MICROSCOPIC (NOT AT [facility]) - Abnormal; Notable for the following:    Specific Gravity, Urine >1.030 (*)    Hgb urine dipstick SMALL (*)    Protein, ur TRACE (*)    All other components within normal limits  URINE MICROSCOPIC-ADD ON - Abnormal; Notable for the following:    Squamous Epithelial / LPF 0-5 (*)    Bacteria, UA FEW (*)  All other components within normal limits  BRAIN NATRIURETIC PEPTIDE  TROPONIN I  INFLUENZA PANEL BY PCR (TYPE A & B, H1N1)    Imaging Review Dg Chest 2 View  11/03/2015  CLINICAL DATA:  Shortness of breath, wheezing today EXAM: CHEST  2 VIEW COMPARISON:  02/09/2014 FINDINGS: Mild cardiomegaly. No confluent airspace opacities. No effusions or acute bony abnormality. IMPRESSION: Cardiomegaly.  No active disease. Electronically Signed   By: Rolm Baptise M.D.   On: 11/03/2015 11:42   I have personally reviewed and evaluated these images and lab results as part of my medical decision-making.   EKG Interpretation   Date/Time:  Saturday November 03 2015 11:05:19 EST Ventricular Rate:  89 PR Interval:  199 QRS Duration: 162 QT Interval:  395 QTC Calculation: 481 R Axis:   -80 Text Interpretation:  Sinus rhythm RBBB and LAFB No previous ECGs  available Confirmed by Kiaja Shorty  MD, Syrina Wake ([id number]) on 11/03/2015 11:23:25  AM      MDM   Final diagnoses:  Bronchitis  Influenza-like illness   Patient from home with generalized  weakness, cough and fever for the past 2 days. Given amoxicillin by her PCP yesterday. Cough productive of yellow sputum. Patient is normally in a wheelchair but is able to transfer and activity which she is not able to do today.  Patient febrile on arrival. She is not in any distress. Borderline hypoxia and 91%. Wheezing on exam with no history of COPD. Nebulizers and steroids given.  X-rays negative for infiltrate. EKG right bundle branch block, no comparison  Flu swab sent. Patient feels improved with nebulizers. However she is "too weak to go home". She cannot stand or pivot Which she normally is able to do.  Advised family that patient may not meet inpatient criteria.  Observation admission discussed with Dr. Dyann Kief.  I personally performed the services described in this documentation, which was scribed in my presence. The recorded information has been reviewed and is accurate.    Ezequiel Essex, MD 11/03/15 [phone]

## 2015-11-03 NOTE — ED Notes (Signed)
CRITICAL VALUE ALERT  Critical value received:  Influenza A positive   Date of notification:  11/03/2015  Time of notification:  [id number]  Critical value read back:Yes.    Nurse who received alert:  Domenica Reamer RN   MD notified (1st page):  Dr Roderic Palau   Time of first page:  1722  MD notified (2nd page):  Time of second page:  Responding MD:    Time MD responded:

## 2015-11-03 NOTE — ED Notes (Signed)
MD at bedside. 

## 2015-11-03 NOTE — ED Notes (Signed)
Pt states she has been unable to ambulate at home.  Generally uses wheelchair.  Being treated for URI with Amoxicillin by her pcp.  Productive cough with yellow sputum.

## 2015-11-04 DIAGNOSIS — J111 Influenza due to unidentified influenza virus with other respiratory manifestations: Secondary | ICD-10-CM | POA: Diagnosis not present

## 2015-11-04 DIAGNOSIS — J40 Bronchitis, not specified as acute or chronic: Secondary | ICD-10-CM | POA: Diagnosis not present

## 2015-11-04 DIAGNOSIS — R197 Diarrhea, unspecified: Secondary | ICD-10-CM

## 2015-11-04 DIAGNOSIS — I10 Essential (primary) hypertension: Secondary | ICD-10-CM | POA: Diagnosis not present

## 2015-11-04 DIAGNOSIS — F418 Other specified anxiety disorders: Secondary | ICD-10-CM | POA: Diagnosis not present

## 2015-11-04 LAB — BASIC METABOLIC PANEL
ANION GAP: 7 (ref 5–15)
BUN: 18 mg/dL (ref 6–20)
CO2: 26 mmol/L (ref 22–32)
Calcium: 9.1 mg/dL (ref 8.9–10.3)
Chloride: 108 mmol/L (ref 101–111)
Creatinine, Ser: 0.7 mg/dL (ref 0.44–1.00)
GFR calc Af Amer: >60 mL/min (ref >60)
GFR calc non Af Amer: >60 mL/min (ref >60)
GLUCOSE: 74 mg/dL (ref 65–99)
POTASSIUM: 3.7 mmol/L (ref 3.5–5.1)
Sodium: 141 mmol/L (ref 135–145)

## 2015-11-04 LAB — CBC
HEMATOCRIT: 36.8 % (ref 36.0–46.0)
HEMOGLOBIN: 11.6 g/dL — AB (ref 12.0–15.0)
MCH: 29.3 pg (ref 26.0–34.0)
MCHC: 31.5 g/dL (ref 30.0–36.0)
MCV: 92.9 fL (ref 78.0–100.0)
Platelets: 150 10*3/uL (ref 150–400)
RBC: 3.96 MIL/uL (ref 3.87–5.11)
RDW: 15.1 % (ref 11.5–15.5)
WBC: 4.9 10*3/uL (ref 4.0–10.5)

## 2015-11-04 MED ORDER — ALBUTEROL SULFATE (2.5 MG/3ML) 0.083% IN NEBU
2.5000 mg | INHALATION_SOLUTION | Freq: Four times a day (QID) | RESPIRATORY_TRACT | Status: DC
  Administered 2015-11-04 – 2015-11-07 (×9): 2.5 mg via RESPIRATORY_TRACT
  Filled 2015-11-04 (×10): qty 3

## 2015-11-04 MED ORDER — SACCHAROMYCES BOULARDII 250 MG PO CAPS
250.0000 mg | ORAL_CAPSULE | Freq: Two times a day (BID) | ORAL | Status: DC
  Administered 2015-11-04 – 2015-11-06 (×4): 250 mg via ORAL
  Filled 2015-11-04 (×4): qty 1

## 2015-11-04 MED ORDER — OSELTAMIVIR PHOSPHATE 30 MG PO CAPS
30.0000 mg | ORAL_CAPSULE | Freq: Two times a day (BID) | ORAL | Status: DC
  Administered 2015-11-04 – 2015-11-06 (×4): 30 mg via ORAL
  Filled 2015-11-04 (×7): qty 1

## 2015-11-04 NOTE — Progress Notes (Signed)
[facility] PROGRESS NOTE  Tazia Neth [id number] DOB: 05-13-1926 DOA: 11/03/2015 PCP: Glenda Chroman., MD  Assessment/Plan: 1-shortness of breath, productive cough and fever: Secondary to Influenza A with respiratory manifestations. -Will continue treatment with Tamiflu -Continue nebulizer treatment, mucolytic's and the use of flutter valve -Will continue doxycycline as part of treatment for her bronchitis.  -Oxygen supplementation as needed and continue prednisone; to help with reactive airway disease.  2-Bronchitis: -As mentioned above will provide supportive care and treatment with doxycycline twice a day  3-Thyroid disease: Will continue the use of Synthroid and will check TSH.  4-History of stroke: -No new deficit appreciated. -Will continue the use of aspirin for secondary prevention -Will ask physical therapy for evaluation and recommendations -Continue risk factors modifications  5-Essential hypertension: Soft but stable -Will continue holding diuretics. IV fluids now switched to maintenance. -Heart healthy diet order -Continue benazepril  6-Hyperlipidemia: -Continue statins  7-Anxiety and depression: -Stable -No suicide ideation or hallucinations -Continue the use of Celexa and Klonopin.  8-Parkinson disease ([place]): -Continue Sinemet  9-diarrhea: Most likely associated to bilateral infection. Antibiotics also contributing. No nausea, vomiting, abdominal pain, currently afebrile and with normal WBCs. Less likely to be secondary to C. Difficile. -Will treat with Florastor -Follow replete electrolytes as needed -Encourage good hydration.  Code Status: Full code Family Communication: Daughter and son at bedside Disposition Plan: Remains inpatient, continue nebulizer treatment, continue antibiotic for bronchitis. Follow physical therapy evaluation and recommendation for safest discharge.   Consultants:  None  Procedures:  See below for x-ray  reports  Antibiotics:  Doxycycline  HPI/Subjective: Overall feeling better. Still with productive cough, shortness of breath and significant wheezing. Patient also with some diarrhea.  Objective: Filed Vitals:   11/04/15 0531 11/04/15 1011  BP: 159/54 148/63  Pulse: 74 93  Temp: 98.4 F (36.9 C)   Resp: 20 17   No intake or output data in the 24 hours ending 11/04/15 1545 Filed Weights   11/03/15 1036  Weight: 93.441 kg (206 lb)    Exam:   General:  Afebrile, some improvement in her symptoms overall. Still short of breath and with significant wheezing/productive cough.  Cardiovascular: S1 and S2, no rubs, no gallops  Respiratory: diffuse wheezing, positive rhonchi; no crackles.  Abdomen: soft, nontender, nondistended, positive bowel sounds  Musculoskeletal: trace edema bilaterally  Data Reviewed: Basic Metabolic Panel:  Recent Labs Lab 11/03/15 1124 11/03/15 1143 11/04/15 0605  NA 141  --  141  K 4.1  --  3.7  CL 106  --  108  CO2 25  --  26  GLUCOSE 107*  --  74  BUN 19  --  18  CREATININE 0.93  --  0.70  CALCIUM 9.6  --  9.1  MG  --  2.1  --    Liver Function Tests:  Recent Labs Lab 11/03/15 1143  AST 44*  ALT 27  ALKPHOS 51  BILITOT 0.4  PROT 7.3  ALBUMIN 3.7   CBC:  Recent Labs Lab 11/03/15 1124 11/04/15 0605  WBC 6.3 4.9  NEUTROABS 5.1  --   HGB 12.4 11.6*  HCT 38.8 36.8  MCV 91.7 92.9  PLT 152 150   Cardiac Enzymes:  Recent Labs Lab 11/03/15 1124  TROPONINI <0.03   BNP (last 3 results)  Recent Labs  11/03/15 1124  BNP 78.0   Studies: Dg Chest 2 View  11/03/2015  CLINICAL DATA:  Shortness of breath, wheezing today EXAM: CHEST  2 VIEW COMPARISON:  02/09/2014  FINDINGS: Mild cardiomegaly. No confluent airspace opacities. No effusions or acute bony abnormality. IMPRESSION: Cardiomegaly.  No active disease. Electronically Signed   By: Rolm Baptise M.D.   On: 11/03/2015 11:42    Scheduled Meds: . albuterol  2.5 mg  Nebulization Q6H  . aspirin EC  325 mg Oral Daily  . benazepril  40 mg Oral Daily  . beta carotene w/minerals  1 tablet Oral Daily  . budesonide (PULMICORT) nebulizer solution  0.25 mg Nebulization BID  . Carbidopa-Levodopa ER  1 tablet Oral TID  . citalopram  40 mg Oral Daily  . clonazePAM  0.5 mg Oral BID  . darifenacin  7.5 mg Oral Daily  . docusate sodium  100 mg Oral Daily  . doxycycline  100 mg Oral Q12H  . folic acid  1 mg Oral Daily  . guaiFENesin  600 mg Oral BID  . heparin  5,000 Units Subcutaneous 3 times per day  . levothyroxine  88 mcg Oral QAC breakfast  . multivitamin with minerals  1 tablet Oral Daily  . oseltamivir  30 mg Oral BID  . pramipexole  0.25 mg Oral TID  . predniSONE  40 mg Oral BID WC  . simvastatin  20 mg Oral Daily   Continuous Infusions:   Principal Problem:   Influenza A with respiratory manifestations Active Problems:   Bronchitis   SOB (shortness of breath)   Thyroid disease   History of stroke   Essential hypertension   Hyperlipidemia   Anxiety and depression   Parkinson disease ([place])    Time spent: 35 minutes    Barton Dubois  [facility] Pager [phone]. If 7PM-7AM, please contact night-coverage at [email or web address], password [facility] 11/04/2015, 3:45 PM

## 2015-11-05 DIAGNOSIS — F418 Other specified anxiety disorders: Secondary | ICD-10-CM | POA: Diagnosis not present

## 2015-11-05 DIAGNOSIS — I10 Essential (primary) hypertension: Secondary | ICD-10-CM | POA: Diagnosis not present

## 2015-11-05 DIAGNOSIS — J40 Bronchitis, not specified as acute or chronic: Secondary | ICD-10-CM | POA: Diagnosis not present

## 2015-11-05 DIAGNOSIS — J111 Influenza due to unidentified influenza virus with other respiratory manifestations: Secondary | ICD-10-CM | POA: Diagnosis not present

## 2015-11-05 LAB — BASIC METABOLIC PANEL
ANION GAP: 7 (ref 5–15)
BUN: 18 mg/dL (ref 6–20)
CALCIUM: 9.4 mg/dL (ref 8.9–10.3)
CHLORIDE: 108 mmol/L (ref 101–111)
CO2: 28 mmol/L (ref 22–32)
CREATININE: 0.69 mg/dL (ref 0.44–1.00)
GFR calc non Af Amer: >60 mL/min (ref >60)
Glucose, Bld: 106 mg/dL — ABNORMAL HIGH (ref 65–99)
Potassium: 4.1 mmol/L (ref 3.5–5.1)
SODIUM: 143 mmol/L (ref 135–145)

## 2015-11-05 MED ORDER — OSELTAMIVIR PHOSPHATE 30 MG PO CAPS: 30.0000 mg | ORAL_CAPSULE | Freq: Two times a day (BID) | ORAL | Status: AC

## 2015-11-05 MED ORDER — HYDROCODONE-ACETAMINOPHEN 5-325 MG PO TABS: 1.0000 | ORAL_TABLET | Freq: Four times a day (QID) | ORAL | Status: DC | PRN

## 2015-11-05 MED ORDER — GUAIFENESIN ER 600 MG PO TB12: 600.0000 mg | ORAL_TABLET | Freq: Two times a day (BID) | ORAL | Status: DC

## 2015-11-05 MED ORDER — PREDNISONE 20 MG PO TABS: ORAL_TABLET | ORAL | Status: DC

## 2015-11-05 MED ORDER — ALBUTEROL SULFATE (2.5 MG/3ML) 0.083% IN NEBU: 2.5000 mg | INHALATION_SOLUTION | RESPIRATORY_TRACT | Status: AC | PRN

## 2015-11-05 MED ORDER — SACCHAROMYCES BOULARDII 250 MG PO CAPS: 250.0000 mg | ORAL_CAPSULE | Freq: Two times a day (BID) | ORAL | Status: DC

## 2015-11-05 MED ORDER — DOXYCYCLINE HYCLATE 100 MG PO TABS: 100.0000 mg | ORAL_TABLET | Freq: Two times a day (BID) | ORAL | Status: AC

## 2015-11-05 MED ORDER — BUDESONIDE 0.25 MG/2ML IN SUSP: 0.2500 mg | Freq: Two times a day (BID) | RESPIRATORY_TRACT | Status: DC

## 2015-11-05 MED ORDER — ASPIRIN EC 325 MG PO TBEC: 325.0000 mg | DELAYED_RELEASE_TABLET | Freq: Every day | ORAL | Status: AC

## 2015-11-05 NOTE — Clinical Social Work Placement (Signed)
   CLINICAL SOCIAL WORK PLACEMENT  NOTE  Date:  11/05/2015  Patient Details  Name: Angelica Torres MRN: [id number] Date of Birth: [birth year]  Clinical Social Work is seeking post-discharge placement for this patient at the [place] level of care (*CSW will initial, date and re-position this form in  chart as items are completed):  Yes   Patient/family provided with [place] Work Department's list of facilities offering this level of care within the geographic area requested by the patient (or if unable, by the patient's family).  Yes   Patient/family informed of their freedom to choose among providers that offer the needed level of care, that participate in Medicare, Medicaid or managed care program needed by the patient, have an available bed and are willing to accept the patient.  Yes   Patient/family informed of [facility]'s ownership interest in [facility] and [facility] as well as of the fact that they are under no obligation to receive care at these facilities.  PASRR submitted to EDS on 11/05/15     PASRR number received on       Existing PASRR number confirmed on       FL2 transmitted to all facilities in geographic area requested by pt/family on 11/05/15     FL2 transmitted to all facilities within larger geographic area on       Patient informed that his/her managed care company has contracts with or will negotiate with certain facilities, including the following:            Patient/family informed of bed offers received.  Patient chooses bed at [facility]     Physician recommends and patient chooses bed at      Patient to be transferred to   on  .  Patient to be transferred to facility by       Patient family notified on   of transfer.  Name of family member notified:        PHYSICIAN       Additional Comment:    _______________________________________________ Ihor Gully, LCSW 11/05/2015, 6:53  PM

## 2015-11-05 NOTE — NC FL2 (Signed)
[place] MEDICAID FL2 LEVEL OF CARE SCREENING TOOL     IDENTIFICATION  Patient Name: Angelica Torres Birthdate: [birth year] Sex: female Admission Date (Current Location): 11/03/2015  [facility] and [place] Number:  [employer] and Address:  [facility] [address] [place]      Provider Number: [id number]  Attending Physician Name and Address:  Barton Dubois, MD  Relative Name and Phone Number:       Current Level of Care: Hospital Recommended Level of Care: [place] Prior Approval Number:    Date Approved/Denied:   PASRR Number:    Discharge Plan: SNF    Current Diagnoses: Patient Active Problem List   Diagnosis Date Noted  . Bronchitis 11/03/2015  . Influenza A with respiratory manifestations 11/03/2015  . SOB (shortness of breath) 11/03/2015  . Thyroid disease 11/03/2015  . History of stroke 11/03/2015  . Essential hypertension 11/03/2015  . Hyperlipidemia 11/03/2015  . Anxiety and depression 11/03/2015  . Parkinson disease ([place]) 11/03/2015    Orientation RESPIRATION BLADDER Height & Weight     Self, Time, Situation, Place  Normal Continent Weight: 206 lb (93.441 kg) Height:  5\' 4"  (162.6 cm)  BEHAVIORAL SYMPTOMS/MOOD NEUROLOGICAL BOWEL NUTRITION STATUS      Continent Diet (Heart Healthy )  AMBULATORY STATUS COMMUNICATION OF NEEDS Skin   Extensive Assist Verbally Normal                       Personal Care Assistance Level of Assistance  Bathing, Dressing Bathing Assistance: Limited assistance   Dressing Assistance: Limited assistance     Functional Limitations Info             SPECIAL CARE FACTORS FREQUENCY  PT (By licensed PT)     PT Frequency:  (5x/week)              Contractures      Additional Factors Info  Psychotropic, Allergies   Allergies Info:  (Sulfa Antibiotics, Codeine) Psychotropic Info:  (Celexa, Klonopin )         Current Medications (11/05/2015):  This is  the current hospital active medication list Current Facility-Administered Medications  Medication Dose Route Frequency Provider Last Rate Last Dose  . acetaminophen (TYLENOL) tablet 650 mg  650 mg Oral Q6H PRN Barton Dubois, MD       Or  . acetaminophen (TYLENOL) suppository 650 mg  650 mg Rectal Q6H PRN Barton Dubois, MD      . albuterol (PROVENTIL) (2.5 MG/3ML) 0.083% nebulizer solution 2.5 mg  2.5 mg Nebulization Q6H Barton Dubois, MD   2.5 mg at 11/05/15 1423  . aspirin EC tablet 325 mg  325 mg Oral Daily Barton Dubois, MD   325 mg at 11/05/15 1100  . benazepril (LOTENSIN) tablet 40 mg  40 mg Oral Daily Barton Dubois, MD   40 mg at 11/05/15 1100  . beta carotene w/minerals (OCUVITE) tablet 1 tablet  1 tablet Oral Daily Barton Dubois, MD   1 tablet at 11/05/15 1003  . budesonide (PULMICORT) nebulizer solution 0.25 mg  0.25 mg Nebulization BID Barton Dubois, MD   0.25 mg at 11/05/15 0859  . Carbidopa-Levodopa ER (SINEMET CR) 25-100 MG tablet controlled release 1 tablet  1 tablet Oral TID Barton Dubois, MD   1 tablet at 11/05/15 1100  . citalopram (CELEXA) tablet 40 mg  40 mg Oral Daily Barton Dubois, MD   40 mg at 11/05/15 1100  . clonazePAM (  KLONOPIN) tablet 0.5 mg  0.5 mg Oral BID Barton Dubois, MD   0.5 mg at 11/05/15 1100  . darifenacin (ENABLEX) 24 hr tablet 7.5 mg  7.5 mg Oral Daily Barton Dubois, MD   7.5 mg at 11/05/15 1100  . docusate sodium (COLACE) capsule 100 mg  100 mg Oral Daily Barton Dubois, MD   100 mg at 11/05/15 1100  . doxycycline (VIBRA-TABS) tablet 100 mg  100 mg Oral Q12H Barton Dubois, MD   100 mg at 11/05/15 1100  . folic acid (FOLVITE) tablet 1 mg  1 mg Oral Daily Barton Dubois, MD   1 mg at 11/05/15 1100  . guaiFENesin (MUCINEX) 12 hr tablet 600 mg  600 mg Oral BID Barton Dubois, MD   600 mg at 11/05/15 1101  . heparin injection 5,000 Units  5,000 Units Subcutaneous 3 times per day Barton Dubois, MD   5,000 Units at 11/05/15 1401  . HYDROcodone-acetaminophen  (NORCO/VICODIN) 5-325 MG per tablet 1-2 tablet  1-2 tablet Oral Q6H PRN Barton Dubois, MD      . levothyroxine (SYNTHROID, LEVOTHROID) tablet 88 mcg  88 mcg Oral QAC breakfast Barton Dubois, MD   88 mcg at 11/05/15 [id number]  . meclizine (ANTIVERT) tablet 12.5 mg  12.5 mg Oral TID PRN Barton Dubois, MD      . multivitamin with minerals tablet 1 tablet  1 tablet Oral Daily Barton Dubois, MD   1 tablet at 11/05/15 1101  . ondansetron (ZOFRAN) tablet 4 mg  4 mg Oral Q6H PRN Barton Dubois, MD       Or  . ondansetron [facility]) injection 4 mg  4 mg Intravenous Q6H PRN Barton Dubois, MD   4 mg at 11/05/15 0550  . oseltamivir (TAMIFLU) capsule 30 mg  30 mg Oral BID Barton Dubois, MD   30 mg at 11/04/15 1217  . pramipexole (MIRAPEX) tablet 0.25 mg  0.25 mg Oral TID Barton Dubois, MD   0.25 mg at 11/05/15 1002  . predniSONE (DELTASONE) tablet 40 mg  40 mg Oral BID WC Barton Dubois, MD   40 mg at 11/05/15 0802  . saccharomyces boulardii (FLORASTOR) capsule 250 mg  250 mg Oral BID Barton Dubois, MD   250 mg at 11/05/15 1100  . simvastatin (ZOCOR) tablet 20 mg  20 mg Oral Daily Barton Dubois, MD   20 mg at 11/05/15 1100     Discharge Medications: Please see discharge summary for a list of discharge medications.  Relevant Imaging Results:  Relevant Lab Results:   Additional Information    [name], Clydene Pugh, LCSW

## 2015-11-05 NOTE — Clinical Social Work Note (Signed)
Clinical Social Work Assessment  Patient Details  Name: Angelica Torres MRN: [id number] Date of Birth: 06/22/1926  Date of referral:  11/05/15               Reason for consult:  Facility Placement                Permission sought to share information with:    Permission granted to share information::     Name::        Agency::     Relationship::     Contact Information:     Housing/Transportation Living arrangements for the past 2 months:  Single Family Home Source of Information:  Adult Children Patient Interpreter Needed:  None Criminal Activity/Legal Involvement Pertinent to Current Situation/Hospitalization:  No - Comment as needed Significant Relationships:  Adult Children Lives with:  Adult Children Do you feel safe going back to the place where you live?  Yes Need for family participation in patient care:  Yes (Comment)  Care giving concerns:  None identified.   Social Worker assessment / plan:  Patient's son advised that patient resides with her daughter.  He stated that patient can uses a walker for very short distances.  He advised that she typically uses a wheelchair.  CSW discussed discharge planning and he stated that he would prefer [facility] or [place] as patient resided in [place]. CSW sent clinicals.  [facility] made bed offer. [facility] has no female beds today.  CSW submitted PASRR application, however patient is under manual review.    Employment status:  Retired Nurse, adult PT Recommendations:    Information / Referral to community resources:  [place]  Patient/Family's Response to care: Agreeable to SNF.   Patient/Family's Understanding of and Emotional Response to Diagnosis, Current Treatment, and Prognosis:  Family verbalizes understanding of diagnosis, treatment and prognosis.   Emotional Assessment Appearance:    Attitude/Demeanor/Rapport:  Unable to Assess Affect (typically observed):  Unable to Assess Orientation:   Oriented to  Time, Oriented to Situation, Oriented to Place, Oriented to Self Alcohol / Substance use:  Not Applicable Psych involvement (Current and /or in the community):  No (Comment)  Discharge Needs  Concerns to be addressed:  Discharge Planning Concerns Readmission within the last 30 days:  No Current discharge risk:  None Barriers to Discharge:  Awaiting State Approval ([place])   Ihor Gully, LCSW 11/05/2015, 6:55 PM

## 2015-11-05 NOTE — Discharge Summary (Signed)
Physician Discharge Summary  Angelica Torres [id number] DOB: 01-11-1926 DOA: 11/03/2015  PCP: Glenda Chroman., MD  Admit date: 11/03/2015 Discharge date: 11/05/2015  Time spent: 40 minutes  Recommendations for Outpatient Follow-up:  Follow BMET in 5 days to assess renal function and electrolytes  Discharge Diagnoses:  Principal Problem:   Influenza A with respiratory manifestations Active Problems:   Bronchitis   SOB (shortness of breath)   Thyroid disease   History of stroke   Essential hypertension   Hyperlipidemia   Anxiety and depression   Parkinson disease ([place])   Discharge Condition: Stable and improved. Patient discharged to skilled nursing facility for further rehabilitation and care. Follow-up within 1 to 2 weeks after discharge from the facility with PCP.  Diet recommendation: Heart healthy diet  Filed Weights   11/03/15 1036  Weight: 93.441 kg (206 lb)    History of present illness:  82 y.o. female with a past medical history significant for hypertension, hyperlipidemia, but isn't disease, history of stroke (with left hemiplegia), thyroid disease and depression/anxiety; who presented to the hospital secondary to shortness of breath, general malaise, productive cough and fever. Patient reports that her symptoms have been present for the last 36-48 hours and worsening. She was seen by her primary care physician who diagnosed her with upper respiratory infection and bronchitis and a start the patient on augmentin, patient didn't tolerate medication well and ended up throwing up at home. Given continue worsening of her symptoms she presented to the emergency department for further evaluation and treatment. In the ED she was found to be mildly tachypneic with a low-grade temperature; chest x-ray with bronchitic changes but no acute infiltrates. Normal WBCs. Influenza by PCR was done and came back to be positive for influenza A. [facility] has been called to admit the  patient for further evaluation and treatment.  Of note, patient with ongoing wheezing and rhonchi. Denies chest pain, orthopnea, abdominal pain, dysuria, melena, hematochezia, hematemesis, hemoptysis, hematuria and any other complaints  Hospital Course:  1-shortness of breath, productive cough and fever: Secondary to Influenza A with respiratory manifestations. -Will continue treatment with Tamiflu as instructed  -Continue nebulizer treatment, mucolytic's and the use of flutter valve -Will continue doxycycline as part of treatment for her bronchitis.  -Oxygen supplementation as needed and continue prednisone; to help with reactive airway disease/underline chronic obstructive disease.  2-Bronchitis: -As mentioned above will provide supportive care and treatment with doxycycline twice a day to complete antibiotics regimen   3-Thyroid disease: Will continue the use of Synthroid -TSH WNL  4-History of stroke: -No new deficit appreciated. -Will continue the use of aspirin for secondary prevention -physical therapy has evaluated patient due to deconditioning and generalized weakness and recommended SNF for rehab and conditioning  -Continue risk factors modifications  5-Essential hypertension: Stable -Will now resume home antihypertensive regimen  -Heart healthy diet order  6-Hyperlipidemia: -Continue statins  7-Anxiety and depression: -Stable -No suicide ideation or hallucinations -Continue the use of Celexa and Klonopin.  8-Parkinson disease ([place]): -Continue Sinemet  9-diarrhea: Most likely associated to bilateral infection. Antibiotics also contributing. No nausea, vomiting, abdominal pain, currently afebrile and with normal WBCs. Unlikely to be secondary to C. Difficile. -treated with Florastor and resolved -Encourage to maintain good hydration. -repeat BMET to follow electrolytes in approx 5 days.  Procedures:  See below for x-ray reports   Consultations:  None    Discharge Exam: Filed Vitals:   11/05/15 1031 11/05/15 1435  BP:  125/56  Pulse: 75 67  Temp:  98.8 F (37.1 C)  Resp:  20    General: Afebrile, significant improvement in her symptoms overall. Still mildly short of breath and with exp wheezing, but with improvement in her coughing spells, no further diarrhea, no fever for 48 hours now.  Cardiovascular: S1 and S2, no rubs, no gallops  Respiratory: mild exp wheezing, scattered rhonchi; no crackles.  Abdomen: soft, nontender, nondistended, positive bowel sounds  Musculoskeletal: trace edema bilaterally  Discharge Instructions   Discharge Instructions    Diet - low sodium heart healthy    Complete by:  As directed      Discharge instructions    Complete by:  As directed   Physical therapy and rehabilitation as per SNF protocol  Low sodium diet Good hydration Medications as prescribed Follow BMET in 5 days to assess renal function and electrolytes Follow up with PCP in 1-2 weeks after discharge from SNF          Current Discharge Medication List    START taking these medications   Details  albuterol (PROVENTIL) (2.5 MG/3ML) 0.083% nebulizer solution Take 3 mLs (2.5 mg total) by nebulization every 4 (four) hours as needed for wheezing or shortness of breath.    budesonide (PULMICORT) 0.25 MG/2ML nebulizer solution Take 2 mLs (0.25 mg total) by nebulization 2 (two) times daily.    doxycycline (VIBRA-TABS) 100 MG tablet Take 1 tablet (100 mg total) by mouth every 12 (twelve) hours.    guaiFENesin (MUCINEX) 600 MG 12 hr tablet Take 1 tablet (600 mg total) by mouth 2 (two) times daily.    oseltamivir (TAMIFLU) 30 MG capsule Take 1 capsule (30 mg total) by mouth 2 (two) times daily.    predniSONE (DELTASONE) 20 MG tablet Take 3 tablets by mouth daily X 2 days; then 2 tablets by mouth daily X 2 days; then 1 tablet by mouth daily X 2 days; then 1/2 tablet by mouth daily X 3 days and stop prednisone    saccharomyces  boulardii (FLORASTOR) 250 MG capsule Take 1 capsule (250 mg total) by mouth 2 (two) times daily.      CONTINUE these medications which have CHANGED   Details  aspirin EC 325 MG tablet Take 1 tablet (325 mg total) by mouth daily.    HYDROcodone-acetaminophen (NORCO/VICODIN) 5-325 MG tablet Take 1 tablet by mouth every 6 (six) hours as needed for severe pain. Qty: 20 tablet, Refills: 0      CONTINUE these medications which have NOT CHANGED   Details  benazepril (LOTENSIN) 40 MG tablet Take 1 tablet by mouth daily.    !! beta carotene w/minerals (OCUVITE) tablet Take 1 tablet by mouth daily.    BREO ELLIPTA 100-25 MCG/INH AEPB Inhale 1 puff into the lungs 2 (two) times daily.    Carbidopa-Levodopa ER (SINEMET CR) 25-100 MG tablet controlled release Take 1 tablet by mouth 3 (three) times daily.    citalopram (CELEXA) 40 MG tablet Take 1 tablet by mouth daily.    clonazePAM (KLONOPIN) 0.5 MG tablet Take 1 tablet by mouth 2 (two) times daily.     docusate sodium (COLACE) 100 MG capsule Take 100 mg by mouth daily.    Fish Oil-Cholecalciferol (FISH OIL + D3 PO) Take 1 capsule by mouth daily.    folic acid (FOLVITE) 1 MG tablet Take 1 mg by mouth daily.    furosemide (LASIX) 20 MG tablet Take 10 tablets by mouth daily.     levothyroxine (SYNTHROID, LEVOTHROID) 88 MCG tablet Take  1 tablet by mouth daily.    meclizine (ANTIVERT) 12.5 MG tablet Take 1 tablet by mouth 3 (three) times daily as needed for dizziness.     !! Multiple Vitamin (MULTIVITAMIN WITH MINERALS) TABS tablet Take 1 tablet by mouth daily.    pramipexole (MIRAPEX) 0.25 MG tablet Take 1 tablet by mouth 3 (three) times daily.    simvastatin (ZOCOR) 20 MG tablet Take 1 tablet by mouth daily.    VESICARE 5 MG tablet Take 1 tablet by mouth daily.     !! - Potential duplicate medications found. Please discuss with provider.    STOP taking these medications     amoxicillin-clavulanate (AUGMENTIN) 875-125 MG tablet       cephALEXin (KEFLEX) 500 MG capsule        Allergies  Allergen Reactions  . Sulfa Antibiotics Other (See Comments)    Reaction unknown to patient  . Codeine Rash   Follow-up Information    Follow up with VYAS,DHRUV B., MD.   Specialty:  Internal Medicine   Why:  in 1-2 weeks after discharge from SNF   Contact information:   [place] [phone]       The results of significant diagnostics from this hospitalization (including imaging, microbiology, ancillary and laboratory) are listed below for reference.    Significant Diagnostic Studies: Dg Chest 2 View  11/03/2015  CLINICAL DATA:  Shortness of breath, wheezing today EXAM: CHEST  2 VIEW COMPARISON:  02/09/2014 FINDINGS: Mild cardiomegaly. No confluent airspace opacities. No effusions or acute bony abnormality. IMPRESSION: Cardiomegaly.  No active disease. Electronically Signed   By: Rolm Baptise M.D.   On: 11/03/2015 11:42   Labs: Basic Metabolic Panel:  Recent Labs Lab 11/03/15 1124 11/03/15 1143 11/04/15 0605 11/05/15 0545  NA 141  --  141 143  K 4.1  --  3.7 4.1  CL 106  --  108 108  CO2 25  --  26 28  GLUCOSE 107*  --  74 106*  BUN 19  --  18 18  CREATININE 0.93  --  0.70 0.69  CALCIUM 9.6  --  9.1 9.4  MG  --  2.1  --   --    Liver Function Tests:  Recent Labs Lab 11/03/15 1143  AST 44*  ALT 27  ALKPHOS 51  BILITOT 0.4  PROT 7.3  ALBUMIN 3.7   CBC:  Recent Labs Lab 11/03/15 1124 11/04/15 0605  WBC 6.3 4.9  NEUTROABS 5.1  --   HGB 12.4 11.6*  HCT 38.8 36.8  MCV 91.7 92.9  PLT 152 150   Cardiac Enzymes:  Recent Labs Lab 11/03/15 1124  TROPONINI <0.03   BNP: BNP (last 3 results)  Recent Labs  11/03/15 1124  BNP 78.0    Signed:  Barton Dubois MD.  [facility] 11/05/2015, 3:20 PM

## 2015-11-05 NOTE — Evaluation (Signed)
Physical Therapy Evaluation Patient Details Name: Angelica Torres MRN: [id number] DOB: [birth year] Today's Date: 11/05/2015   History of Present Illness  82yo white female comes to [facility] on 2/25 c fever, malaise, SOB and cough. Admitted after found to be FLuA positive. PMH: HTN, HLD, CVA c mild R hemiparesis, and PD x15y.   Clinical Impression  At evaluation, pt is received semirecumbent in bed upon entry, family/caregiver present. The pt is awake and agreeable to participate. No acute distress noted at this time. The pt is alert and oriented x3, pleasant, conversational, and following simple and multi-step commands consistently. Pt demonstrates profound weakness during session requiring total physical assistance for correction and avoidance of fall. Pt grip strength is mildly weak and symmetrical; global strength as screened during functional mobility assessment presents with severe impairment requiring mod assist for bed mobility/transfer, and total assist+2 for standing pivot bed to chair, whereas the patient performs these indep at baseline.  Pt received on and remaining on room air throughout evaluation, with noted at saturation 91% with all activity, whereas pt is not on O2 at baseline at home. Patient presenting with impairment of strength, range of motion, balance, oxygen perfusion, and activity tolerance, limiting ability to perform ADL and mobility tasks at  baseline level of function. Patient will benefit from skilled intervention to address the above impairments and limitations, in order to restore to prior level of function, improve patient safety upon discharge, and to decrease falls risk.       Follow Up Recommendations SNF    Equipment Recommendations  None recommended by PT    Recommendations for Other Services       Precautions / Restrictions Precautions Precautions: Fall Restrictions Weight Bearing Restrictions: No      Mobility  Bed Mobility Overal bed mobility: Needs  Assistance Bed Mobility: Supine to Sit     Supine to sit: Mod assist;HOB elevated        Transfers Overall transfer level: Needs assistance Equipment used: 1 person hand held assist Transfers: Sit to/from [employer] Transfers Sit to Stand: Mod assist Stand pivot transfers: Total assist;+2 physical assistance       General transfer comment: pooor confidence, anxiety, cannot bring trunk over feet, difficulty taking steps.   Ambulation/Gait Ambulation/Gait assistance:  (none. )              Stairs            Wheelchair Mobility    Modified Rankin (Stroke Patients Only)       Balance Overall balance assessment: History of Falls;Needs assistance Sitting-balance support: Single extremity supported Sitting balance-Leahy Scale: Poor     Standing balance support: Bilateral upper extremity supported Standing balance-Leahy Scale: Zero                               Pertinent Vitals/Pain Pain Assessment: No/denies pain    Home Living Family/patient expects to be discharged to:: Private residence Living Arrangements: Children Available Help at Discharge: Family Type of Home: House Home Access: Ramped entrance     Home Layout: One level Home Equipment: Environmental consultant - 2 wheels;Walker - 4 wheels;Wheelchair - power;Wheelchair - manual;Bedside commode (elevating bed; )      Prior Function Level of Independence: Needs assistance   Gait / Transfers Assistance Needed: does not ambulate at baseline; only standing pivot transfers at baseline.   ADL's / Homemaking Assistance Needed: dresses and toilets indep; requires set up and supervision  for bathing with aide 2x weekly        Hand Dominance   Dominant Hand: Right    Extremity/Trunk Assessment   Upper Extremity Assessment: Generalized weakness           Lower Extremity Assessment: Generalized weakness      Cervical / Trunk Assessment:  (poor strength and endurance. )  Communication    Communication: HOH  Cognition Arousal/Alertness: Awake/alert Behavior During Therapy: WFL for tasks assessed/performed;Anxious Overall Cognitive Status: Within Functional Limits for tasks assessed                      General Comments      Exercises        Assessment/Plan    PT Assessment Patient needs continued PT services  PT Diagnosis Difficulty walking;Generalized weakness   PT Problem List Decreased strength;Decreased range of motion;Decreased activity tolerance;Decreased balance;Decreased mobility;Obesity  PT Treatment Interventions DME instruction;Functional mobility training;Gait training;Therapeutic activities;Therapeutic exercise;Balance training;Patient/family education   PT Goals (Current goals can be found in the Care Plan section) Acute Rehab PT Goals Patient Stated Goal: Regain strength to be safe at home; PT Goal Formulation: With patient/family Time For Goal Achievement: 11/19/15 Potential to Achieve Goals: Fair    Frequency Min 3X/week   Barriers to discharge Decreased caregiver support      Co-evaluation               End of Session Equipment Utilized During Treatment: Gait belt Activity Tolerance: Patient limited by fatigue;Patient tolerated treatment well Patient left: in chair;with call bell/phone within reach;with family/visitor present Nurse Communication: Mobility status    Functional Assessment Tool Used: Clinical Judgment  Functional Limitation: Changing and maintaining body position Changing and Maintaining Body Position Current Status [id number]): At least 60 percent but less than 80 percent impaired, limited or restricted Changing and Maintaining Body Position Goal Status [id number]): At least 40 percent but less than 60 percent impaired, limited or restricted    Time: 0931-1003 PT Time Calculation (min) (ACUTE ONLY): 32 min   Charges:   PT Evaluation $PT Eval Moderate Complexity: 1 Procedure PT Treatments $Therapeutic  Activity: 8-22 mins   PT G Codes:   PT G-Codes **NOT FOR INPATIENT CLASS** Functional Assessment Tool Used: Clinical Judgment  Functional Limitation: Changing and maintaining body position Changing and Maintaining Body Position Current Status [id number]): At least 60 percent but less than 80 percent impaired, limited or restricted Changing and Maintaining Body Position Goal Status [id number]): At least 40 percent but less than 60 percent impaired, limited or restricted   10:46 AM, 11/05/2015 Etta Grandchild, PT, DPT PRN Physical Therapist at [facility] License # [id number] (wireless)  [phone] (mobile)

## 2015-11-05 NOTE — Care Management Note (Signed)
Case Management Note  Patient Details  Name: Athenea Snitker MRN: [id number] Date of Birth: 24-Jun-1926  Subjective/Objective:                  Pt is from home, lives with her husband and is ind with ADL's at baseline. PT has recommended SNF. Pt/family is agreeable, pt ready for DC today. CSW is aware of DC today and is working to find placement.   Action/Plan: No CM needs.   Expected Discharge Date:      11/05/2015            Expected Discharge Plan:  Skilled Nursing Facility  In-House Referral:  Clinical Social Work  Discharge planning Services  CM Consult  Post Acute Care Choice:  NA Choice offered to:  NA  DME Arranged:    DME Agency:     HH Arranged:    [place] Agency:     Status of Service:  Completed, signed off  Medicare Important Message Given:    Date Medicare IM Given:    Medicare IM give by:    Date Additional Medicare IM Given:    Additional Medicare Important Message give by:     If discussed at [place] of Stay Meetings, dates discussed:    Additional Comments:  Sherald Barge, RN 11/05/2015, 4:42 PM

## 2015-11-06 NOTE — Progress Notes (Signed)
Patient seen and examined. Stable and continue improving overall. Denies chest pain, breathing easier and is afebrile. She is hemodynamically stable and ready to be transferred for rehabilitation to skilled nursing facility. At this moment waiting on PASARR number. Had discharge summary and medication reconciliation record has been reviewed and remains appropriate. Patient is medically stable and okay for discharge from my standpoint.  Barton Dubois [phone]

## 2015-11-06 NOTE — Progress Notes (Signed)
CSW waiting on assignment of PASARR number.  Facility aware.  CSW will continue to follow and facilitate disposition as appropriate.  Creta Levin, [place] Coverage [phone]

## 2015-11-06 NOTE — Progress Notes (Signed)
EMS transport arranged on behalf of pt.

## 2015-11-06 NOTE — Progress Notes (Signed)
Pt is ok to d/c to the [facility].  Pt/family agreeable to plan.

## 2015-11-07 NOTE — Progress Notes (Signed)
Phone call made to the [address] center in [place] to inform staff that patient is still at [facility]. EMS was contacted last night, and they have not arrived to transport patient. Spoken with [facility], and updated her. EMS will be contacted this morning again for patient pick up and transfer.

## 2015-11-07 NOTE — Progress Notes (Signed)
Discharged to [place], transported by [facility] in stable condition.

## 2015-11-07 NOTE — Progress Notes (Signed)
Phone call made to the [facility] center to inform them patient is scheduled to be picked up for transfer at 86 am at  [facility]. Person taking phone call verbalized understanding.

## 2015-11-07 NOTE — Progress Notes (Signed)
Call received from EMS confirming that patient is ready for pick up. Informed her that the [facility] is not accepting transfers on 3rd shift. Transfer rescheduled for 8 am with EMS call center.

## 2015-11-23 ENCOUNTER — Encounter (HOSPITAL_COMMUNITY): Payer: Self-pay | Admitting: Emergency Medicine

## 2015-11-23 ENCOUNTER — Inpatient Hospital Stay (HOSPITAL_COMMUNITY): Payer: Medicare Other

## 2015-11-23 ENCOUNTER — Observation Stay (HOSPITAL_COMMUNITY)
Admission: EM | Admit: 2015-11-23 | Discharge: 2015-11-26 | Disposition: A | Discharge status: Home / Self Care | Payer: Medicare Other | Attending: Internal Medicine | Admitting: Internal Medicine

## 2015-11-23 ENCOUNTER — Emergency Department (HOSPITAL_COMMUNITY): Payer: Medicare Other

## 2015-11-23 DIAGNOSIS — J449 Chronic obstructive pulmonary disease, unspecified: Secondary | ICD-10-CM | POA: Insufficient documentation

## 2015-11-23 DIAGNOSIS — Z87891 Personal history of nicotine dependence: Secondary | ICD-10-CM | POA: Diagnosis not present

## 2015-11-23 DIAGNOSIS — R509 Fever, unspecified: Secondary | ICD-10-CM

## 2015-11-23 DIAGNOSIS — Z7982 Long term (current) use of aspirin: Secondary | ICD-10-CM | POA: Diagnosis not present

## 2015-11-23 DIAGNOSIS — Z79899 Other long term (current) drug therapy: Secondary | ICD-10-CM | POA: Diagnosis not present

## 2015-11-23 DIAGNOSIS — E079 Disorder of thyroid, unspecified: Secondary | ICD-10-CM | POA: Diagnosis present

## 2015-11-23 DIAGNOSIS — R05 Cough: Secondary | ICD-10-CM | POA: Diagnosis not present

## 2015-11-23 DIAGNOSIS — G20 Parkinson's disease: Secondary | ICD-10-CM | POA: Diagnosis not present

## 2015-11-23 DIAGNOSIS — Z8673 Personal history of transient ischemic attack (TIA), and cerebral infarction without residual deficits: Secondary | ICD-10-CM | POA: Diagnosis not present

## 2015-11-23 DIAGNOSIS — R059 Cough, unspecified: Secondary | ICD-10-CM

## 2015-11-23 DIAGNOSIS — J189 Pneumonia, unspecified organism: Principal | ICD-10-CM | POA: Insufficient documentation

## 2015-11-23 DIAGNOSIS — I10 Essential (primary) hypertension: Secondary | ICD-10-CM | POA: Diagnosis not present

## 2015-11-23 DIAGNOSIS — J159 Unspecified bacterial pneumonia: Secondary | ICD-10-CM | POA: Diagnosis present

## 2015-11-23 HISTORY — DX: Influenza due to other identified influenza virus with other respiratory manifestations: J10.1

## 2015-11-23 HISTORY — DX: Chronic obstructive pulmonary disease, unspecified: J44.9

## 2015-11-23 LAB — CBC WITH DIFFERENTIAL/PLATELET
Basophils Absolute: 0 10*3/uL (ref 0.0–0.1)
Basophils Relative: 0 %
EOS PCT: 2 %
Eosinophils Absolute: 0.2 10*3/uL (ref 0.0–0.7)
HCT: 37 % (ref 36.0–46.0)
Hemoglobin: 11.7 g/dL — ABNORMAL LOW (ref 12.0–15.0)
LYMPHS ABS: 1 10*3/uL (ref 0.7–4.0)
LYMPHS PCT: 13 %
MCH: 29.4 pg (ref 26.0–34.0)
MCHC: 31.6 g/dL (ref 30.0–36.0)
MCV: 93 fL (ref 78.0–100.0)
MONO ABS: 0.5 10*3/uL (ref 0.1–1.0)
MONOS PCT: 6 %
Neutro Abs: 6.6 10*3/uL (ref 1.7–7.7)
Neutrophils Relative %: 79 %
PLATELETS: 143 10*3/uL — AB (ref 150–400)
RBC: 3.98 MIL/uL (ref 3.87–5.11)
RDW: 15.6 % — AB (ref 11.5–15.5)
WBC: 8.2 10*3/uL (ref 4.0–10.5)

## 2015-11-23 LAB — LACTIC ACID, PLASMA
Lactic Acid, Venous: 1.7 mmol/L (ref 0.5–2.0)
Lactic Acid, Venous: 1 mmol/L (ref 0.5–2.0)

## 2015-11-23 LAB — BASIC METABOLIC PANEL
Anion gap: 8 (ref 5–15)
BUN: 16 mg/dL (ref 6–20)
CALCIUM: 9.2 mg/dL (ref 8.9–10.3)
CO2: 26 mmol/L (ref 22–32)
Chloride: 104 mmol/L (ref 101–111)
Creatinine, Ser: 0.91 mg/dL (ref 0.44–1.00)
GFR, EST NON AFRICAN AMERICAN: 55 mL/min — AB (ref >60)
GLUCOSE: 119 mg/dL — AB (ref 65–99)
POTASSIUM: 4.3 mmol/L (ref 3.5–5.1)
SODIUM: 138 mmol/L (ref 135–145)

## 2015-11-23 LAB — URINALYSIS, ROUTINE W REFLEX MICROSCOPIC
Bilirubin Urine: NEGATIVE
Glucose, UA: NEGATIVE mg/dL
HGB URINE DIPSTICK: NEGATIVE
LEUKOCYTES UA: NEGATIVE
NITRITE: NEGATIVE
PROTEIN: NEGATIVE mg/dL
SPECIFIC GRAVITY, URINE: 1.015 (ref 1.005–1.030)
pH: 6 (ref 5.0–8.0)

## 2015-11-23 LAB — BRAIN NATRIURETIC PEPTIDE: B NATRIURETIC PEPTIDE 5: 34 pg/mL (ref 0.0–100.0)

## 2015-11-23 LAB — TROPONIN I

## 2015-11-23 MED ORDER — MECLIZINE HCL 12.5 MG PO TABS: 12.5000 mg | ORAL_TABLET | Freq: Three times a day (TID) | ORAL | Status: DC | PRN

## 2015-11-23 MED ORDER — SODIUM CHLORIDE 0.9 % IV SOLN
INTRAVENOUS | Status: DC
  Administered 2015-11-23: 10:00:00 via INTRAVENOUS

## 2015-11-23 MED ORDER — HEPARIN SODIUM (PORCINE) 5000 UNIT/ML IJ SOLN
5000.0000 [IU] | Freq: Three times a day (TID) | INTRAMUSCULAR | Status: DC
  Administered 2015-11-23 – 2015-11-26 (×10): 5000 [IU] via SUBCUTANEOUS
  Filled 2015-11-23 (×9): qty 1

## 2015-11-23 MED ORDER — DARIFENACIN HYDROBROMIDE ER 7.5 MG PO TB24
7.5000 mg | ORAL_TABLET | Freq: Every day | ORAL | Status: DC
  Administered 2015-11-24 – 2015-11-26 (×3): 7.5 mg via ORAL
  Filled 2015-11-23 (×3): qty 1

## 2015-11-23 MED ORDER — BUDESONIDE 0.25 MG/2ML IN SUSP
0.2500 mg | Freq: Two times a day (BID) | RESPIRATORY_TRACT | Status: DC
  Administered 2015-11-23 – 2015-11-26 (×7): 0.25 mg via RESPIRATORY_TRACT
  Filled 2015-11-23 (×7): qty 2

## 2015-11-23 MED ORDER — IPRATROPIUM-ALBUTEROL 0.5-2.5 (3) MG/3ML IN SOLN
3.0000 mL | Freq: Once | RESPIRATORY_TRACT | Status: AC
  Administered 2015-11-23: 3 mL via RESPIRATORY_TRACT
  Filled 2015-11-23: qty 3

## 2015-11-23 MED ORDER — DOCUSATE SODIUM 100 MG PO CAPS
100.0000 mg | ORAL_CAPSULE | Freq: Every day | ORAL | Status: DC
  Administered 2015-11-24 – 2015-11-26 (×3): 100 mg via ORAL
  Filled 2015-11-23 (×3): qty 1

## 2015-11-23 MED ORDER — CLONAZEPAM 0.5 MG PO TABS
0.5000 mg | ORAL_TABLET | Freq: Two times a day (BID) | ORAL | Status: DC
  Administered 2015-11-23 – 2015-11-26 (×4): 0.5 mg via ORAL
  Filled 2015-11-23 (×4): qty 1

## 2015-11-23 MED ORDER — ALBUTEROL SULFATE (2.5 MG/3ML) 0.083% IN NEBU
2.5000 mg | INHALATION_SOLUTION | RESPIRATORY_TRACT | Status: AC
  Administered 2015-11-23 (×3): 2.5 mg via RESPIRATORY_TRACT
  Filled 2015-11-23 (×3): qty 3

## 2015-11-23 MED ORDER — SODIUM CHLORIDE 0.9 % IV SOLN
INTRAVENOUS | Status: AC
  Administered 2015-11-23: 18:00:00 via INTRAVENOUS

## 2015-11-23 MED ORDER — PRAMIPEXOLE DIHYDROCHLORIDE 0.25 MG PO TABS
0.2500 mg | ORAL_TABLET | Freq: Three times a day (TID) | ORAL | Status: DC
  Administered 2015-11-23 – 2015-11-26 (×9): 0.25 mg via ORAL
  Filled 2015-11-23 (×16): qty 1

## 2015-11-23 MED ORDER — ASPIRIN EC 325 MG PO TBEC
325.0000 mg | DELAYED_RELEASE_TABLET | Freq: Every day | ORAL | Status: DC
  Administered 2015-11-24 – 2015-11-26 (×3): 325 mg via ORAL
  Filled 2015-11-23 (×3): qty 1

## 2015-11-23 MED ORDER — ONDANSETRON HCL 4 MG/2ML IJ SOLN: 4.0000 mg | Freq: Four times a day (QID) | INTRAMUSCULAR | Status: DC | PRN

## 2015-11-23 MED ORDER — FUROSEMIDE 80 MG PO TABS: 200.0000 mg | ORAL_TABLET | Freq: Every day | ORAL | Status: DC

## 2015-11-23 MED ORDER — VANCOMYCIN HCL IN DEXTROSE 750-5 MG/150ML-% IV SOLN
750.0000 mg | Freq: Two times a day (BID) | INTRAVENOUS | Status: DC
  Administered 2015-11-23 – 2015-11-26 (×6): 750 mg via INTRAVENOUS
  Filled 2015-11-23 (×8): qty 150

## 2015-11-23 MED ORDER — FLUTICASONE FUROATE-VILANTEROL 100-25 MCG/INH IN AEPB
1.0000 | INHALATION_SPRAY | Freq: Two times a day (BID) | RESPIRATORY_TRACT | Status: DC
  Administered 2015-11-23 – 2015-11-26 (×6): 1 via RESPIRATORY_TRACT
  Filled 2015-11-23: qty 28

## 2015-11-23 MED ORDER — SODIUM CHLORIDE 0.9 % IV SOLN
250.0000 mL | INTRAVENOUS | Status: DC | PRN
  Administered 2015-11-24: 250 mL via INTRAVENOUS

## 2015-11-23 MED ORDER — OSELTAMIVIR PHOSPHATE 30 MG PO CAPS
30.0000 mg | ORAL_CAPSULE | Freq: Two times a day (BID) | ORAL | Status: DC
  Administered 2015-11-23: 30 mg via ORAL
  Filled 2015-11-23 (×7): qty 1

## 2015-11-23 MED ORDER — SIMVASTATIN 20 MG PO TABS
20.0000 mg | ORAL_TABLET | Freq: Every day | ORAL | Status: DC
  Administered 2015-11-24 – 2015-11-26 (×3): 20 mg via ORAL
  Filled 2015-11-23 (×3): qty 1

## 2015-11-23 MED ORDER — SODIUM CHLORIDE 0.9 % IV SOLN
1500.0000 mg | Freq: Once | INTRAVENOUS | Status: AC
  Administered 2015-11-23: 1500 mg via INTRAVENOUS
  Filled 2015-11-23: qty 1500

## 2015-11-23 MED ORDER — ONDANSETRON HCL 4 MG PO TABS: 4.0000 mg | ORAL_TABLET | Freq: Four times a day (QID) | ORAL | Status: DC | PRN

## 2015-11-23 MED ORDER — CARBIDOPA-LEVODOPA ER 25-100 MG PO TBCR
1.0000 | EXTENDED_RELEASE_TABLET | Freq: Three times a day (TID) | ORAL | Status: DC
  Administered 2015-11-23 – 2015-11-26 (×9): 1 via ORAL
  Filled 2015-11-23 (×9): qty 1

## 2015-11-23 MED ORDER — SACCHAROMYCES BOULARDII 250 MG PO CAPS
250.0000 mg | ORAL_CAPSULE | Freq: Two times a day (BID) | ORAL | Status: DC
  Administered 2015-11-23 – 2015-11-26 (×6): 250 mg via ORAL
  Filled 2015-11-23 (×6): qty 1

## 2015-11-23 MED ORDER — SODIUM CHLORIDE 0.9% FLUSH
3.0000 mL | Freq: Two times a day (BID) | INTRAVENOUS | Status: DC
  Administered 2015-11-23 – 2015-11-26 (×5): 3 mL via INTRAVENOUS

## 2015-11-23 MED ORDER — ALBUTEROL SULFATE (2.5 MG/3ML) 0.083% IN NEBU
2.5000 mg | INHALATION_SOLUTION | Freq: Once | RESPIRATORY_TRACT | Status: AC
  Administered 2015-11-23: 2.5 mg via RESPIRATORY_TRACT
  Filled 2015-11-23: qty 3

## 2015-11-23 MED ORDER — DEXTROSE 5 % IV SOLN
2.0000 g | INTRAVENOUS | Status: DC
  Administered 2015-11-23 – 2015-11-24 (×2): 2 g via INTRAVENOUS
  Filled 2015-11-23 (×3): qty 2

## 2015-11-23 MED ORDER — ACETAMINOPHEN 325 MG PO TABS
650.0000 mg | ORAL_TABLET | Freq: Four times a day (QID) | ORAL | Status: DC | PRN
  Administered 2015-11-24 – 2015-11-25 (×3): 650 mg via ORAL
  Filled 2015-11-23 (×3): qty 2

## 2015-11-23 MED ORDER — CITALOPRAM HYDROBROMIDE 20 MG PO TABS
40.0000 mg | ORAL_TABLET | Freq: Every day | ORAL | Status: DC
  Administered 2015-11-24 – 2015-11-26 (×3): 40 mg via ORAL
  Filled 2015-11-23 (×3): qty 2

## 2015-11-23 MED ORDER — SODIUM CHLORIDE 0.9% FLUSH: 3.0000 mL | INTRAVENOUS | Status: DC | PRN

## 2015-11-23 MED ORDER — HYDROCODONE-ACETAMINOPHEN 5-325 MG PO TABS
1.0000 | ORAL_TABLET | Freq: Four times a day (QID) | ORAL | Status: DC | PRN
  Administered 2015-11-26: 1 via ORAL
  Filled 2015-11-23: qty 1

## 2015-11-23 MED ORDER — GUAIFENESIN ER 600 MG PO TB12
600.0000 mg | ORAL_TABLET | Freq: Two times a day (BID) | ORAL | Status: DC
  Administered 2015-11-23 – 2015-11-26 (×6): 600 mg via ORAL
  Filled 2015-11-23 (×6): qty 1

## 2015-11-23 MED ORDER — POLYETHYLENE GLYCOL 3350 17 G PO PACK
17.0000 g | PACK | Freq: Every day | ORAL | Status: DC | PRN
  Administered 2015-11-25: 17 g via ORAL
  Filled 2015-11-23: qty 1

## 2015-11-23 MED ORDER — SODIUM CHLORIDE 0.9 % IV BOLUS (SEPSIS)
500.0000 mL | Freq: Once | INTRAVENOUS | Status: AC
  Administered 2015-11-23: 500 mL via INTRAVENOUS

## 2015-11-23 MED ORDER — ALBUTEROL SULFATE (2.5 MG/3ML) 0.083% IN NEBU: 2.5000 mg | INHALATION_SOLUTION | RESPIRATORY_TRACT | Status: DC | PRN

## 2015-11-23 MED ORDER — LEVOTHYROXINE SODIUM 88 MCG PO TABS
88.0000 ug | ORAL_TABLET | Freq: Every day | ORAL | Status: DC
  Administered 2015-11-24 – 2015-11-26 (×3): 88 ug via ORAL
  Filled 2015-11-23 (×3): qty 1

## 2015-11-23 MED ORDER — CEFEPIME HCL 1 G IJ SOLR
1.0000 g | Freq: Once | INTRAMUSCULAR | Status: AC
  Administered 2015-11-23: 1 g via INTRAVENOUS
  Filled 2015-11-23: qty 1

## 2015-11-23 MED ORDER — HALOPERIDOL LACTATE 5 MG/ML IJ SOLN: 1.0000 mg | Freq: Four times a day (QID) | INTRAMUSCULAR | Status: DC | PRN

## 2015-11-23 MED ORDER — ACETAMINOPHEN 650 MG RE SUPP: 650.0000 mg | Freq: Four times a day (QID) | RECTAL | Status: DC | PRN

## 2015-11-23 NOTE — Progress Notes (Signed)
Pharmacy Antibiotic Note  Angelica Torres is a 82 y.o. female admitted on 11/23/2015 with pneumonia.  Pharmacy has been consulted for VANCOMYCIN dosing.  Plan:  Vancomycin 750mg  IV q12hrs  Continue Cefepime 2gm IV q24hrs per MD  Check vanc trough at steady state  Deescalate ABX when improved / appropriate  Monitor labs, renal fxn, progress and c/s  Weight: 206 lb (93.441 kg)  Temp (24hrs), Avg:100.2 F (37.9 C), Min:98.8 F (37.1 C), Max:101.7 F (38.7 C)   Recent Labs Lab 11/23/15 0753 11/23/15 1007  WBC 8.2  --   CREATININE 0.91  --   LATICACIDVEN 1.7 1.0    Estimated Creatinine Clearance: 48.3 mL/min (by C-G formula based on Cr of 0.91).    Allergies  Allergen Reactions  . Sulfa Antibiotics Other (See Comments)    Reaction unknown to patient  . Codeine Rash   Anti-infectives    Start     Dose/Rate Route Frequency Ordered Stop   11/23/15 2200  ceFEPIme (MAXIPIME) 2 g in dextrose 5 % 50 mL IVPB     2 g 100 mL/hr over 30 Minutes Intravenous Every 24 hours 11/23/15 1148 12/01/15 2159   11/23/15 2200  vancomycin (VANCOCIN) IVPB 750 mg/150 ml premix     750 mg 150 mL/hr over 60 Minutes Intravenous Every 12 hours 11/23/15 1159     11/23/15 1200  oseltamivir (TAMIFLU) capsule 30 mg     30 mg Oral 2 times daily 11/23/15 1148 11/28/15 0959   11/23/15 1100  vancomycin (VANCOCIN) 1,500 mg in sodium chloride 0.9 % 500 mL IVPB     1,500 mg 250 mL/hr over 120 Minutes Intravenous  Once 11/23/15 1006     11/23/15 1000  ceFEPIme (MAXIPIME) 1 g in dextrose 5 % 50 mL IVPB     1 g 100 mL/hr over 30 Minutes Intravenous  Once 11/23/15 0954 11/23/15 1038     Dose adjustments this admission: n/a  No results found for this or any previous visit (from the past 240 hour(s)).  Thank you for allowing pharmacy to be a part of this patient's care.  Hart Robinsons A 11/23/2015 12:01 PM

## 2015-11-23 NOTE — ED Notes (Signed)
Hospitalist at bedside 

## 2015-11-23 NOTE — ED Notes (Signed)
X-ray at bedside

## 2015-11-23 NOTE — ED Provider Notes (Signed)
CSN: [id number]     Arrival date & time 11/23/15  [id number] History   First MD Initiated Contact with Patient 11/23/15 [phone]     Chief Complaint  Patient presents with  . Fever      HPI Pt was seen at 0725. Per pt and her family, c/o gradual onset and worsening of persistent cough for the past 1 week. Has been associated with generalized weakness and home fever to "101" today. Family states pt was "unable to stand up from the commode" this morning. Pt's symptoms began after she returned home from rehab 1 week ago, s/p hospital admission for influenza A. Denies N/V/D, no CP/SOB, no abd pain, no focal motor weakness, no tingling/numbness in extremities. LD tylenol PTA.    Past Medical History  Diagnosis Date  . Hypertension   . Parkinson disease ([place])   . Thyroid disease   . High cholesterol   . Stroke ([place])   . COPD (chronic obstructive pulmonary disease) ([place])   . Influenza A 10/2015   Past Surgical History  Procedure Laterality Date  . Back surgery    . Kidney suregery    . Cholecystectomy      Social History  Substance Use Topics  . Smoking status: Former Research scientist (life sciences)  . Smokeless tobacco: Former Systems developer    Quit date: 09/08/1964  . Alcohol Use: No    Review of Systems ROS: Statement: All systems negative except as marked or noted in the HPI; Constitutional: +fever and chills, generalized weakness/fatigue.. ; ; Eyes: Negative for eye pain, redness and discharge. ; ; ENMT: Negative for ear pain, hoarseness, nasal congestion, sinus pressure and sore throat. ; ; Cardiovascular: Negative for chest pain, palpitations, diaphoresis, dyspnea and peripheral edema. ; ; Respiratory: +cough. Negative for wheezing and stridor. ; ; Gastrointestinal: Negative for nausea, vomiting, diarrhea, abdominal pain, blood in stool, hematemesis, jaundice and rectal bleeding. . ; ; Genitourinary: Negative for dysuria, flank pain and hematuria. ; ; Musculoskeletal: Negative for back pain and neck pain. Negative for  swelling and trauma.; ; Skin: Negative for pruritus, rash, abrasions, blisters, bruising and skin lesion.; ; Neuro: Negative for headache, lightheadedness and neck stiffness. Negative for altered level of consciousness , altered mental status, extremity weakness, paresthesias, involuntary movement, seizure and syncope.      Allergies  Sulfa antibiotics and Codeine  Home Medications   Prior to Admission medications   Medication Sig Start Date End Date Taking? Authorizing Provider  albuterol (PROVENTIL) (2.5 MG/3ML) 0.083% nebulizer solution Take 3 mLs (2.5 mg total) by nebulization every 4 (four) hours as needed for wheezing or shortness of breath. 11/05/15   Barton Dubois, MD  aspirin EC 325 MG tablet Take 1 tablet (325 mg total) by mouth daily. 11/05/15   Barton Dubois, MD  benazepril (LOTENSIN) 40 MG tablet Take 1 tablet by mouth daily. 08/21/15   Historical Provider, MD  beta carotene w/minerals (OCUVITE) tablet Take 1 tablet by mouth daily.    Historical Provider, MD  BREO ELLIPTA 100-25 MCG/INH AEPB Inhale 1 puff into the lungs 2 (two) times daily. 08/15/15   Historical Provider, MD  budesonide (PULMICORT) 0.25 MG/2ML nebulizer solution Take 2 mLs (0.25 mg total) by nebulization 2 (two) times daily. 11/05/15   Barton Dubois, MD  Carbidopa-Levodopa ER (SINEMET CR) 25-100 MG tablet controlled release Take 1 tablet by mouth 3 (three) times daily. 08/21/15   Historical Provider, MD  citalopram (CELEXA) 40 MG tablet Take 1 tablet by mouth daily. 08/21/15   Historical Provider,  MD  clonazePAM (KLONOPIN) 0.5 MG tablet Take 1 tablet by mouth 2 (two) times daily.  08/21/15   Historical Provider, MD  docusate sodium (COLACE) 100 MG capsule Take 100 mg by mouth daily.    Historical Provider, MD  Fish Oil-Cholecalciferol (FISH OIL + D3 PO) Take 1 capsule by mouth daily.    Historical Provider, MD  folic acid (FOLVITE) 1 MG tablet Take 1 mg by mouth daily.    Historical Provider, MD  furosemide (LASIX) 20  MG tablet Take 10 tablets by mouth daily.  07/25/15   Historical Provider, MD  guaiFENesin (MUCINEX) 600 MG 12 hr tablet Take 1 tablet (600 mg total) by mouth 2 (two) times daily. 11/05/15   Barton Dubois, MD  HYDROcodone-acetaminophen (NORCO/VICODIN) 5-325 MG tablet Take 1 tablet by mouth every 6 (six) hours as needed for severe pain. 11/05/15   Barton Dubois, MD  levothyroxine (SYNTHROID, LEVOTHROID) 88 MCG tablet Take 1 tablet by mouth daily. 08/21/15   Historical Provider, MD  meclizine (ANTIVERT) 12.5 MG tablet Take 1 tablet by mouth 3 (three) times daily as needed for dizziness.  10/15/15   Historical Provider, MD  Multiple Vitamin (MULTIVITAMIN WITH MINERALS) TABS tablet Take 1 tablet by mouth daily.    Historical Provider, MD  pramipexole (MIRAPEX) 0.25 MG tablet Take 1 tablet by mouth 3 (three) times daily. 08/21/15   Historical Provider, MD  predniSONE (DELTASONE) 20 MG tablet Take 3 tablets by mouth daily X 2 days; then 2 tablets by mouth daily X 2 days; then 1 tablet by mouth daily X 2 days; then 1/2 tablet by mouth daily X 3 days and stop prednisone 11/05/15   Barton Dubois, MD  saccharomyces boulardii (FLORASTOR) 250 MG capsule Take 1 capsule (250 mg total) by mouth 2 (two) times daily. 11/05/15   Barton Dubois, MD  simvastatin (ZOCOR) 20 MG tablet Take 1 tablet by mouth daily. 08/21/15   Historical Provider, MD  VESICARE 5 MG tablet Take 1 tablet by mouth daily. 08/21/15   Historical Provider, MD   BP 152/57 mmHg  Pulse 100  Temp(Src) 100 F (37.8 C) (Oral)  Resp 20  Wt 206 lb (93.441 kg)  SpO2 92%   Patient Vitals for the past 24 hrs:  BP Temp Temp src Pulse Resp SpO2 Weight  11/23/15 0930 119/56 mmHg - - 100 18 91 % -  11/23/15 0830 122/57 mmHg - - 92 16 99 % -  11/23/15 0819 - - - - - 93 % -  11/23/15 0752 - 101.7 F (38.7 C) Rectal - - - -  11/23/15 0744 137/69 mmHg - - - 24 - -  11/23/15 0716 152/57 mmHg 100 F (37.8 C) Oral 100 20 92 % 206 lb (93.441 kg)   08:37:52  Orthostatic Vital Signs CK  Orthostatic Lying  - BP- Lying: 109/75 mmHg ; Pulse- Lying: 97  Orthostatic Sitting - BP- Sitting: 127/55 mmHg ; Pulse- Sitting: 106  Orthostatic Standing at 0 minutes - BP- Standing at 0 minutes:  (Pt unable to stand at this time)       Physical Exam  0730: Physical examination:  Nursing notes reviewed; Vital signs and O2 SAT reviewed; +febrile.;; Constitutional: Well developed, Well nourished, In no acute distress; Head:  Normocephalic, atraumatic; Eyes: EOMI, PERRL, No scleral icterus; ENMT: Mouth and pharynx normal, Mucous membranes dry; Neck: Supple, Full range of motion, No lymphadenopathy; Cardiovascular: Tachycardic rhythm and rate. No gallop; Respiratory: Breath sounds diminished & equal bilaterally, No wheezes. Speaking  sentences, Normal respiratory effort/excursion; Chest: Nontender, Movement normal; Abdomen: Soft, Nontender, Nondistended, Normal bowel sounds; Genitourinary: No CVA tenderness; Extremities: Pulses normal, No tenderness, No edema, No calf edema or asymmetry.; Neuro: AA&Ox3, Major CN grossly intact.  Speech clear. No gross focal motor or sensory deficits in extremities.; Skin: Color normal, Warm, Dry.   ED Course  Procedures (including critical care time)  Labs Review Imaging Review I have personally reviewed and evaluated these images and lab results as part of my medical decision-making.   EKG Interpretation   Date/Time:  Friday November 23 2015 07:17:11 EDT Ventricular Rate:  98 PR Interval:  183 QRS Duration: 150 QT Interval:  373 QTC Calculation: 476 R Axis:   -85 Text Interpretation:  Sinus rhythm RBBB and LAFB When compared with ECG of  11/03/2015 No significant change was found Confirmed by [facility]  MD,  Nunzio Cory [phone]) on 11/23/2015 7:55:32 AM      EKG Interpretation  Date/Time:  Friday November 23 2015 07:39:41 EDT Ventricular Rate:  96 PR Interval:  183 QRS Duration: 150 QT Interval:  383 QTC Calculation: 484 R  Axis:   -73 Text Interpretation:  Sinus rhythm RBBB and LAFB Artifact Since last tracing of earlier today No significant change was found Confirmed by [facility]  MD, Nunzio Cory [phone]) on 11/23/2015 7:56:43 AM        MDM  MDM Reviewed: previous chart, nursing note and vitals Reviewed previous: labs and ECG Interpretation: labs, ECG and x-ray     Results for orders placed or performed during the hospital encounter of 11/23/15  Urinalysis, Routine w reflex microscopic  Result Value Ref Range   Color, Urine YELLOW YELLOW   APPearance CLEAR CLEAR   Specific Gravity, Urine 1.015 1.005 - 1.030   pH 6.0 5.0 - 8.0   Glucose, UA NEGATIVE NEGATIVE mg/dL   Hgb urine dipstick NEGATIVE NEGATIVE   Bilirubin Urine NEGATIVE NEGATIVE   Ketones, ur TRACE (A) NEGATIVE mg/dL   Protein, ur NEGATIVE NEGATIVE mg/dL   Nitrite NEGATIVE NEGATIVE   Leukocytes, UA NEGATIVE NEGATIVE  Basic metabolic panel  Result Value Ref Range   Sodium 138 135 - 145 mmol/L   Potassium 4.3 3.5 - 5.1 mmol/L   Chloride 104 101 - 111 mmol/L   CO2 26 22 - 32 mmol/L   Glucose, Bld 119 (H) 65 - 99 mg/dL   BUN 16 6 - 20 mg/dL   Creatinine, Ser 0.91 0.44 - 1.00 mg/dL   Calcium 9.2 8.9 - 10.3 mg/dL   GFR calc non Af Amer 55 (L) >60 mL/min   GFR calc Af Amer >60 >60 mL/min   Anion gap 8 5 - 15  Troponin I  Result Value Ref Range   Troponin I <0.03 <0.031 ng/mL  Lactic acid, plasma  Result Value Ref Range   Lactic Acid, Venous 1.7 0.5 - 2.0 mmol/L  CBC with Differential  Result Value Ref Range   WBC 8.2 4.0 - 10.5 K/uL   RBC 3.98 3.87 - 5.11 MIL/uL   Hemoglobin 11.7 (L) 12.0 - 15.0 g/dL   HCT 37.0 36.0 - 46.0 %   MCV 93.0 78.0 - 100.0 fL   MCH 29.4 26.0 - 34.0 pg   MCHC 31.6 30.0 - 36.0 g/dL   RDW 15.6 (H) 11.5 - 15.5 %   Platelets 143 (L) 150 - 400 K/uL   Neutrophils Relative % 79 %   Neutro Abs 6.6 1.7 - 7.7 K/uL   Lymphocytes Relative 13 %   Lymphs  Abs 1.0 0.7 - 4.0 K/uL   Monocytes Relative 6 %   Monocytes  Absolute 0.5 0.1 - 1.0 K/uL   Eosinophils Relative 2 %   Eosinophils Absolute 0.2 0.0 - 0.7 K/uL   Basophils Relative 0 %   Basophils Absolute 0.0 0.0 - 0.1 K/uL   Dg Chest Portable 1 View 11/23/2015  CLINICAL DATA:  Generalized weakness, cough and fever. EXAM: PORTABLE CHEST 1 VIEW COMPARISON:  11/03/2015; 04/11/2014; 9611/20 13 FINDINGS: Grossly unchanged enlarged cardiac silhouette and mediastinal contours with atherosclerotic plaque within the thoracic aorta. Decreased lung volumes with worsening bibasilar opacities, left greater than right. Trace bilateral effusions are not excluded. Pulmonary venous congestion without frank evidence of edema. No pneumothorax. Unchanged bones. IMPRESSION: 1. Decreased lung volumes with worsening bibasilar opacities, left greater than right, atelectasis versus infiltrate. Further evaluation with a PA and lateral chest radiograph may be obtained as clinically indicated. 2. Cardiomegaly and pulmonary venous congestion without frank evidence of edema. Electronically Signed   By: Sandi Mariscal M.D.   On: 11/23/2015 08:05    1000:  Family gave APAP PTA. Short neb given on arrival with increase O2 Sats to 99% R/A. Pt unable to stand for orthostatic VS d/t generalized weakness. Will start IV abx for HCAP. Dx and testing d/w pt and family.  Questions answered.  Verb understanding, agreeable to admit.  T/C to [facility] Dr. Aileen Fass, case discussed, including:  HPI, pertinent PM/SHx, VS/PE, dx testing, ED course and treatment:  Agreeable to admit, requests to write temporary orders, obtain medical bed to team APAdmits.   Francine Graven, DO 11/24/15 2028

## 2015-11-23 NOTE — ED Notes (Signed)
Pt allowed to eat per EDP.

## 2015-11-23 NOTE — ED Notes (Signed)
Respiratory at bedside.

## 2015-11-23 NOTE — ED Notes (Signed)
Dr. McManus at bedside updating patient and family. 

## 2015-11-23 NOTE — H&P (Signed)
History and Physical    Angelica Torres [id number] DOB: 1925-09-19 DOA: 11/23/2015  Referring MD/NP/PA: Dr. Thurnell Garbe PCP: Glenda Chroman., MD  Outpatient Specialists: Patient coming from: Home  Chief Complaint: Cough and generalized weakness  HPI: Angelica Torres is a 82 y.o. female with past medical history significant for hypertension, history of stroke, and a recent influenza infection that comes in for productive cough that has not improved and fever that started this morning. As per family members with fevers of 101 this morning. She's had a productive cough that has not improved. Also this morning she was slightly confused and with worsening bilateral weakness. And she cannot get off the commode. As per patient she does feel mildly short of breath no nausea or vomiting or chest pain, she denies that she is just so weak that she doesn't feel like moving. No new medications or changes in medication.  ED Course:  On arrival to the ED she was mildly tachycardic orthostatic were attempted but she couldn't stand up she has mild, cytopenia her lactic acid is 1.7 she was started on IV fluid hydration, UA is negative for nitrates and a chest x-ray shows a possible infiltrates.  Review of Systems:  Constitutional:  No weight loss, night sweats, Fevers, chills, fatigue.  HEENT:  No headaches, Difficulty swallowing,Tooth/dental problems,Sore throat,  No sneezing, itching, ear ache, nasal congestion, post nasal drip,  Cardio-vascular:  No chest pain, Orthopnea, PND, swelling in lower extremities, anasarca, dizziness, palpitations  GI:  No heartburn, indigestion, abdominal pain, nausea, vomiting, diarrhea, change in bowel habits, loss of appetite  Resp:  No shortness of breath with exertion or at rest. No excess mucus, no productive cough, No non-productive cough, No coughing up of blood.No change in color of mucus.No wheezing.No chest wall deformity  Skin:  no rash or lesions.  GU:  no  dysuria, change in color of urine, no urgency or frequency. No flank pain.  Musculoskeletal:  No joint pain or swelling. No decreased range of motion. No back pain.  Psych:  No change in mood or affect. No depression or anxiety. No memory loss.   Past Medical History  Diagnosis Date  . Hypertension   . Parkinson disease ([place])   . Thyroid disease   . High cholesterol   . Stroke ([place])   . COPD (chronic obstructive pulmonary disease) ([place])   . Influenza A 10/2015    Past Surgical History  Procedure Laterality Date  . Back surgery    . Kidney suregery    . Cholecystectomy       reports that she has quit smoking. She quit smokeless tobacco use about 51 years ago. She reports that she does not drink alcohol or use illicit drugs.  Allergies  Allergen Reactions  . Sulfa Antibiotics Other (See Comments)    Reaction unknown to patient  . Codeine Rash    No family history on file. She has a family history of hypertension and heart problems.  Prior to Admission medications   Medication Sig Start Date End Date Taking? Authorizing Provider  albuterol (PROVENTIL) (2.5 MG/3ML) 0.083% nebulizer solution Take 3 mLs (2.5 mg total) by nebulization every 4 (four) hours as needed for wheezing or shortness of breath. 11/05/15  Yes Barton Dubois, MD  aspirin EC 325 MG tablet Take 1 tablet (325 mg total) by mouth daily. 11/05/15  Yes Barton Dubois, MD  benazepril (LOTENSIN) 40 MG tablet Take 1 tablet by mouth daily. 08/21/15  Yes Historical Provider, MD  beta carotene  w/minerals (OCUVITE) tablet Take 1 tablet by mouth daily.   Yes Historical Provider, MD  BREO ELLIPTA 100-25 MCG/INH AEPB Inhale 1 puff into the lungs 2 (two) times daily. 08/15/15  Yes Historical Provider, MD  budesonide (PULMICORT) 0.25 MG/2ML nebulizer solution Take 2 mLs (0.25 mg total) by nebulization 2 (two) times daily. 11/05/15  Yes Barton Dubois, MD  Carbidopa-Levodopa ER (SINEMET CR) 25-100 MG tablet controlled release Take 1  tablet by mouth 3 (three) times daily. 08/21/15  Yes Historical Provider, MD  citalopram (CELEXA) 40 MG tablet Take 1 tablet by mouth daily. 08/21/15  Yes Historical Provider, MD  clonazePAM (KLONOPIN) 0.5 MG tablet Take 1 tablet by mouth 2 (two) times daily.  08/21/15  Yes Historical Provider, MD  docusate sodium (COLACE) 100 MG capsule Take 100 mg by mouth daily.   Yes Historical Provider, MD  Fish Oil-Cholecalciferol (FISH OIL + D3 PO) Take 1 capsule by mouth daily.   Yes Historical Provider, MD  folic acid (FOLVITE) 1 MG tablet Take 1 mg by mouth daily.   Yes Historical Provider, MD  furosemide (LASIX) 20 MG tablet Take 10 tablets by mouth daily.  07/25/15  Yes Historical Provider, MD  guaiFENesin (MUCINEX) 600 MG 12 hr tablet Take 1 tablet (600 mg total) by mouth 2 (two) times daily. 11/05/15  Yes Barton Dubois, MD  HYDROcodone-acetaminophen (NORCO/VICODIN) 5-325 MG tablet Take 1 tablet by mouth every 6 (six) hours as needed for severe pain. 11/05/15  Yes Barton Dubois, MD  levothyroxine (SYNTHROID, LEVOTHROID) 88 MCG tablet Take 1 tablet by mouth daily. 08/21/15  Yes Historical Provider, MD  meclizine (ANTIVERT) 12.5 MG tablet Take 1 tablet by mouth 3 (three) times daily as needed for dizziness.  10/15/15  Yes Historical Provider, MD  Multiple Vitamin (MULTIVITAMIN WITH MINERALS) TABS tablet Take 1 tablet by mouth daily.   Yes Historical Provider, MD  pramipexole (MIRAPEX) 0.25 MG tablet Take 1 tablet by mouth 3 (three) times daily. 08/21/15  Yes Historical Provider, MD  simvastatin (ZOCOR) 20 MG tablet Take 1 tablet by mouth daily. 08/21/15  Yes Historical Provider, MD  VESICARE 5 MG tablet Take 1 tablet by mouth daily. 08/21/15  Yes Historical Provider, MD  predniSONE (DELTASONE) 20 MG tablet Take 3 tablets by mouth daily X 2 days; then 2 tablets by mouth daily X 2 days; then 1 tablet by mouth daily X 2 days; then 1/2 tablet by mouth daily X 3 days and stop prednisone Patient not taking: Reported  on 11/23/2015 11/05/15   Barton Dubois, MD  saccharomyces boulardii (FLORASTOR) 250 MG capsule Take 1 capsule (250 mg total) by mouth 2 (two) times daily. Patient not taking: Reported on 11/23/2015 11/05/15   Barton Dubois, MD    Physical Exam: Filed Vitals:   11/23/15 0819 11/23/15 0830 11/23/15 0930 11/23/15 1000  BP:  122/57 119/56 123/52  Pulse:  92 100 97  Temp:      TempSrc:      Resp:  16 18 22   Weight:      SpO2: 93% 99% 91% 91%     GENERAL: NAD  HEENT: head NCAT, no scleral icterus. Pupils round and reactive. Mucous membranes are moist. Posterior pharynx clear of any exudate or lesions.  NECK: Supple. No carotid bruits. No lymphadenopathy or thyromegaly.  LUNGS: She has good air movement with wheezing bilaterally with crackles on the left.  HEART: Regular rate and rhythm without murmur. 2+ pulses, no JVD, no peripheral edema  ABDOMEN: Soft, nontender, and  nondistended. Positive bowel sounds.   EXTREMITIES: Without any cyanosis or clubbing  NEUROLOGIC: Alert and oriented x3. Cranial nerves II through XII are grossly intact. Strength 5/5 in all 4.  PSYCHIATRIC: Normal mood and affect.   SKIN: No ulceration or induration present. No rashes. No lesions   Labs on Admission: I have personally reviewed following labs and imaging studies  CBC:  Recent Labs Lab 11/23/15 0753  WBC 8.2  NEUTROABS 6.6  HGB 11.7*  HCT 37.0  MCV 93.0  PLT [id number]*   Basic Metabolic Panel:  Recent Labs Lab 11/23/15 0753  NA 138  K 4.3  CL 104  CO2 26  GLUCOSE 119*  BUN 16  CREATININE 0.91  CALCIUM 9.2   GFR: Estimated Creatinine Clearance: 48.3 mL/min (by C-G formula based on Cr of 0.91). Liver Function Tests: No results for input(s): AST, ALT, ALKPHOS, BILITOT, PROT, ALBUMIN in the last 168 hours. No results for input(s): LIPASE, AMYLASE in the last 168 hours. No results for input(s): AMMONIA in the last 168 hours. Coagulation Profile: No results for input(s): INR, PROTIME  in the last 168 hours. Cardiac Enzymes:  Recent Labs Lab 11/23/15 0753  TROPONINI <0.03   BNP (last 3 results) No results for input(s): PROBNP in the last 8760 hours. HbA1C: No results for input(s): HGBA1C in the last 72 hours. CBG: No results for input(s): GLUCAP in the last 168 hours. Lipid Profile: No results for input(s): CHOL, HDL, LDLCALC, TRIG, CHOLHDL, LDLDIRECT in the last 72 hours. Thyroid Function Tests: No results for input(s): TSH, T4TOTAL, FREET4, T3FREE, THYROIDAB in the last 72 hours. Anemia Panel: No results for input(s): VITAMINB12, FOLATE, FERRITIN, TIBC, IRON, RETICCTPCT in the last 72 hours. Urine analysis:    Component Value Date/Time   COLORURINE YELLOW 11/23/2015 0846   APPEARANCEUR CLEAR 11/23/2015 0846   LABSPEC 1.015 11/23/2015 0846   PHURINE 6.0 11/23/2015 0846   GLUCOSEU NEGATIVE 11/23/2015 0846   HGBUR NEGATIVE 11/23/2015 0846   BILIRUBINUR NEGATIVE 11/23/2015 0846   KETONESUR TRACE* 11/23/2015 0846   PROTEINUR NEGATIVE 11/23/2015 0846   NITRITE NEGATIVE 11/23/2015 0846   LEUKOCYTESUR NEGATIVE 11/23/2015 0846   Sepsis Labs: @LABRCNTIP (procalcitonin:4,lacticidven:4) )No results found for this or any previous visit (from the past 240 hour(s)).   Radiological Exams on Admission: Dg Chest Portable 1 View  11/23/2015  CLINICAL DATA:  Generalized weakness, cough and fever. EXAM: PORTABLE CHEST 1 VIEW COMPARISON:  11/03/2015; 04/11/2014; 9611/20 13 FINDINGS: Grossly unchanged enlarged cardiac silhouette and mediastinal contours with atherosclerotic plaque within the thoracic aorta. Decreased lung volumes with worsening bibasilar opacities, left greater than right. Trace bilateral effusions are not excluded. Pulmonary venous congestion without frank evidence of edema. No pneumothorax. Unchanged bones. IMPRESSION: 1. Decreased lung volumes with worsening bibasilar opacities, left greater than right, atelectasis versus infiltrate. Further evaluation with a  PA and lateral chest radiograph may be obtained as clinically indicated. 2. Cardiomegaly and pulmonary venous congestion without frank evidence of edema. Electronically Signed   By: Sandi Mariscal M.D.   On: 11/23/2015 08:05    EKG: Independently reviewed. Sinus rhythm right bundle branch block with left anterior vascular block.  Assessment/Plan HCAP (healthcare-associated pneumonia)/Fever/Cough: I am concerned about a healthcare associated pneumonia, he did a chest x-ray that showed worsening infiltrates, so we'll check a CT scan of the chest without contrast. The fact that she had fever, persistent cough with new generalized weakness she relates is new for her is concerning, she doesn't have any leukocytosis but elderly people tend  to have pneumonia and present with minimal symptoms. We'll also check a BNP Get blood cultures and sputum cultures, start empirically on IV vancomycin and cefepime performance. Also check an influenza PCR as she might develop influenza type B and started on Tamiflu empirically.  Thyroid disease Continue Synthroid.  History of stroke A Synthroid continue current home regimen.    Essential hypertension: Continue current home regimen.  Parkinson's disease continue current home regimen.  DVT prophylaxis: heparin Code Status: Full Family Communication: daughter Disposition Plan: inpatient Consults called: none   Charlynne Cousins MD [facility] Pager [phone]  If 7PM-7AM, please contact night-coverage [email or web address] Password [facility]  11/23/2015, 11:23 AM

## 2015-11-23 NOTE — ED Notes (Signed)
Phlebotomy at bedside for second lactic acid draw.

## 2015-11-23 NOTE — ED Notes (Addendum)
Per EMS, pt from home c/o generalized weakness, fever, and cough. Pt recently hospitalized for the flu. Pt's temp was 101.3 per family and given Tylenol PTA. CBG 158. Denies CP or SOB.

## 2015-11-24 DIAGNOSIS — J189 Pneumonia, unspecified organism: Secondary | ICD-10-CM | POA: Diagnosis not present

## 2015-11-24 DIAGNOSIS — I10 Essential (primary) hypertension: Secondary | ICD-10-CM | POA: Diagnosis not present

## 2015-11-24 DIAGNOSIS — Z8673 Personal history of transient ischemic attack (TIA), and cerebral infarction without residual deficits: Secondary | ICD-10-CM

## 2015-11-24 DIAGNOSIS — R509 Fever, unspecified: Secondary | ICD-10-CM | POA: Diagnosis not present

## 2015-11-24 LAB — CBC
HCT: 33.6 % — ABNORMAL LOW (ref 36.0–46.0)
Hemoglobin: 10.6 g/dL — ABNORMAL LOW (ref 12.0–15.0)
MCH: 29.2 pg (ref 26.0–34.0)
MCHC: 31.5 g/dL (ref 30.0–36.0)
MCV: 92.6 fL (ref 78.0–100.0)
Platelets: 130 10*3/uL — ABNORMAL LOW (ref 150–400)
RBC: 3.63 MIL/uL — ABNORMAL LOW (ref 3.87–5.11)
RDW: 15.9 % — AB (ref 11.5–15.5)
WBC: 6.3 10*3/uL (ref 4.0–10.5)

## 2015-11-24 LAB — BASIC METABOLIC PANEL
Anion gap: 6 (ref 5–15)
BUN: 10 mg/dL (ref 6–20)
CALCIUM: 9.1 mg/dL (ref 8.9–10.3)
CO2: 27 mmol/L (ref 22–32)
CREATININE: 0.73 mg/dL (ref 0.44–1.00)
Chloride: 108 mmol/L (ref 101–111)
GFR calc Af Amer: >60 mL/min (ref >60)
GLUCOSE: 106 mg/dL — AB (ref 65–99)
Potassium: 4.1 mmol/L (ref 3.5–5.1)
SODIUM: 141 mmol/L (ref 135–145)

## 2015-11-24 LAB — INFLUENZA PANEL BY PCR (TYPE A & B)
H1N1 flu by pcr: NOT DETECTED
INFLAPCR: NEGATIVE
Influenza B By PCR: NEGATIVE

## 2015-11-24 LAB — HIV ANTIBODY (ROUTINE TESTING W REFLEX): HIV SCREEN 4TH GENERATION: NONREACTIVE

## 2015-11-24 NOTE — Progress Notes (Addendum)
Client had elevated temp and heart rate; paged Dr.Ortiz twice to advise, awaiting MD response

## 2015-11-24 NOTE — Progress Notes (Signed)
Message sent to Md to change patient's Klonopin to one time per day at bedtime only. Patient's daughter wanted to make sure it is only ordered for patient at night.

## 2015-11-24 NOTE — Progress Notes (Signed)
[facility] PROGRESS NOTE    Progress Note   Angelica Torres [id number] DOB: Mar 21, 1926 DOA: 12-22-2015 PCP: Glenda Chroman., MD   Brief Narrative:   Angelica Torres is an 82 y.o. female past medical history of hypertension stroke recent influenza pneumonia that comes in for weakness and fevers and started the day of admission. She's also been complaining of a productive cough.  Assessment/Plan:   HCAP (healthcare-associated pneumonia)/Fever/Cough There is a documented fever today, her heart rate did not increase with this fever, will recheck will continue IV vancomycin and cefepime. Influenza PCR was negative, DC Tamiflu. DC contacts about diet she relates she feels much better compared to yesterday. Get a PT consult as swallowing evaluation.  Thyroid disease: Continue Synthroid.  History of stroke: Continue aspirin.  Essential hypertension Mildly elevated,     DVT Prophylaxis - Lovenox ordered.  Family Communication: daughter Disposition Plan: D/c ion 2-3 days Code Status:     Code Status Orders        Start     Ordered   22-Dec-2015 1149  Full code   Continuous     Dec 22, 2015 1148    Code Status History    Date Active Date Inactive Code Status Order ID Comments User Context   11/03/2015  6:46 PM 11/07/2015 11:19 AM Full Code [id number]  Barton Dubois, MD Inpatient    Advance Directive Documentation        Most Recent Value   Type of Advance Directive  Healthcare Power of Attorney   Pre-existing out of facility DNR order (yellow form or pink MOST form)     "MOST" Form in Place?          IV Access:    Peripheral IV   Procedures and diagnostic studies:   Ct Chest Wo Contrast  December 22, 2015  CLINICAL DATA:  Weakness and cough. Atelectasis versus worsening bibasilar infiltrates by chest x-ray. EXAM: CT CHEST WITHOUT CONTRAST TECHNIQUE: Multidetector CT imaging of the chest was performed following the standard protocol without IV contrast. COMPARISON:   Chest x-ray earlier today. FINDINGS: Radiographic findings were likely largely due to atelectasis as there is no evidence by CT of bibasilar airspace disease. There are scattered areas of subsegmental atelectasis and scarring in both lungs as well as potential component of subtle chronic interstitial lung disease. No edema, airspace consolidation, pneumothorax, nodule or lymphadenopathy is seen. The thoracic aorta is of normal caliber and demonstrates calcified atherosclerotic plaque at the level of the aortic arch. The heart size is normal. No pleural or pericardial fluid is identified. There is a small to moderate-sized hiatal hernia. IMPRESSION: No evidence of pulmonary infiltrate. Scattered scarring, atelectasis and probable component of interstitial lung disease present. Electronically Signed   By: Aletta Edouard M.D.   On: 2015-12-22 15:31   Dg Chest Portable 1 View  12/22/2015  CLINICAL DATA:  Generalized weakness, cough and fever. EXAM: PORTABLE CHEST 1 VIEW COMPARISON:  11/03/2015; 04/11/2014; 9611/20 13 FINDINGS: Grossly unchanged enlarged cardiac silhouette and mediastinal contours with atherosclerotic plaque within the thoracic aorta. Decreased lung volumes with worsening bibasilar opacities, left greater than right. Trace bilateral effusions are not excluded. Pulmonary venous congestion without frank evidence of edema. No pneumothorax. Unchanged bones. IMPRESSION: 1. Decreased lung volumes with worsening bibasilar opacities, left greater than right, atelectasis versus infiltrate. Further evaluation with a PA and lateral chest radiograph may be obtained as clinically indicated. 2. Cardiomegaly and pulmonary venous congestion without frank evidence of edema. Electronically Signed   By: Sandi Mariscal  M.D.   On: 11/23/2015 08:05     Medical Consultants:    None.  Anti-Infectives:   Anti-infectives    Start     Dose/Rate Route Frequency Ordered Stop   11/23/15 2200  ceFEPIme (MAXIPIME) 2 g in  dextrose 5 % 50 mL IVPB     2 g 100 mL/hr over 30 Minutes Intravenous Every 24 hours 11/23/15 1148 12/01/15 2159   11/23/15 2200  vancomycin (VANCOCIN) IVPB 750 mg/150 ml premix     750 mg 150 mL/hr over 60 Minutes Intravenous Every 12 hours 11/23/15 1159     11/23/15 1200  oseltamivir (TAMIFLU) capsule 30 mg     30 mg Oral 2 times daily 11/23/15 1148 11/28/15 0959   11/23/15 1100  vancomycin (VANCOCIN) 1,500 mg in sodium chloride 0.9 % 500 mL IVPB     1,500 mg 250 mL/hr over 120 Minutes Intravenous  Once 11/23/15 1006 11/23/15 1253   11/23/15 1000  ceFEPIme (MAXIPIME) 1 g in dextrose 5 % 50 mL IVPB     1 g 100 mL/hr over 30 Minutes Intravenous  Once 11/23/15 0954 11/23/15 1038      Subjective:    [name] she relates she feels much better than yesterday. No good mood and good attitude she is also very hungry.  Objective:    Filed Vitals:   11/23/15 2112 11/24/15 0505 11/24/15 0802 11/24/15 0808  BP: 155/61 154/67    Pulse: 109 87    Temp: 99.7 F (37.6 C) 101.2 F (38.4 C)    TempSrc: Oral Oral    Resp: 20 20    Height:      Weight:      SpO2: 97% 94% 96% 96%    Intake/Output Summary (Last 24 hours) at 11/24/15 1031 Last data filed at 11/23/15 1900  Gross per 24 hour  Intake    980 ml  Output      0 ml  Net    980 ml   Filed Weights   11/23/15 0716 11/23/15 1149  Weight: 93.441 kg (206 lb) 93.441 kg (206 lb)    Exam: Gen:  NAD Cardiovascular:  RRR. Chest and lungs:   Good air movement clear to auscultation. Abdomen:  Abdomen soft, NT/ND, + BS Extremities:  No edema   Data Reviewed:    Labs: Basic Metabolic Panel:  Recent Labs Lab 11/23/15 0753 11/24/15 0611  NA 138 141  K 4.3 4.1  CL 104 108  CO2 26 27  GLUCOSE 119* 106*  BUN 16 10  CREATININE 0.91 0.73  CALCIUM 9.2 9.1   GFR Estimated Creatinine Clearance: 54.9 mL/min (by C-G formula based on Cr of 0.73). Liver Function Tests: No results for input(s): AST, ALT, ALKPHOS, BILITOT,  PROT, ALBUMIN in the last 168 hours. No results for input(s): LIPASE, AMYLASE in the last 168 hours. No results for input(s): AMMONIA in the last 168 hours. Coagulation profile No results for input(s): INR, PROTIME in the last 168 hours.  CBC:  Recent Labs Lab 11/23/15 0753 11/24/15 0611  WBC 8.2 6.3  NEUTROABS 6.6  --   HGB 11.7* 10.6*  HCT 37.0 33.6*  MCV 93.0 92.6  PLT 143* 130*   Cardiac Enzymes:  Recent Labs Lab 11/23/15 0753  TROPONINI <0.03   BNP (last 3 results) No results for input(s): PROBNP in the last 8760 hours. CBG: No results for input(s): GLUCAP in the last 168 hours. D-Dimer: No results for input(s): DDIMER in the last 72 hours. Hgb  A1c: No results for input(s): HGBA1C in the last 72 hours. Lipid Profile: No results for input(s): CHOL, HDL, LDLCALC, TRIG, CHOLHDL, LDLDIRECT in the last 72 hours. Thyroid function studies: No results for input(s): TSH, T4TOTAL, T3FREE, THYROIDAB in the last 72 hours.  Invalid input(s): FREET3 Anemia work up: No results for input(s): VITAMINB12, FOLATE, FERRITIN, TIBC, IRON, RETICCTPCT in the last 72 hours. Sepsis Labs:  Recent Labs Lab 11/23/15 0753 11/23/15 1007 11/24/15 0611  WBC 8.2  --  6.3  LATICACIDVEN 1.7 1.0  --    Microbiology Recent Results (from the past 240 hour(s))  Culture, blood (routine x 2) Call MD if unable to obtain prior to antibiotics being given     Status: None (Preliminary result)   Collection Time: 11/23/15 11:50 AM  Result Value Ref Range Status   Specimen Description BLOOD LEFT HAND  Final   Special Requests BOTTLES DRAWN AEROBIC AND ANAEROBIC 8 CC EACH  Final   Culture NO GROWTH < 24 HOURS  Final   Report Status PENDING  Incomplete  Culture, blood (routine x 2) Call MD if unable to obtain prior to antibiotics being given     Status: None (Preliminary result)   Collection Time: 11/23/15 12:49 PM  Result Value Ref Range Status   Specimen Description BLOOD LEFT ARM  Final    Special Requests   Final    BOTTLES DRAWN AEROBIC AND ANAEROBIC 8 CC AEB AND 5 CC ANA   Culture NO GROWTH < 24 HOURS  Final   Report Status PENDING  Incomplete     Medications:   . aspirin EC  325 mg Oral Daily  . budesonide  0.25 mg Nebulization BID  . Carbidopa-Levodopa ER  1 tablet Oral TID  . ceFEPime (MAXIPIME) IV  2 g Intravenous Q24H  . citalopram  40 mg Oral Daily  . clonazePAM  0.5 mg Oral BID  . darifenacin  7.5 mg Oral Daily  . docusate sodium  100 mg Oral Daily  . fluticasone furoate-vilanterol  1 puff Inhalation BID  . furosemide  200 mg Oral Daily  . guaiFENesin  600 mg Oral BID  . heparin  5,000 Units Subcutaneous 3 times per day  . levothyroxine  88 mcg Oral QAC breakfast  . oseltamivir  30 mg Oral BID  . pramipexole  0.25 mg Oral TID  . saccharomyces boulardii  250 mg Oral BID  . simvastatin  20 mg Oral Daily  . sodium chloride flush  3 mL Intravenous Q12H  . vancomycin  750 mg Intravenous Q12H   Continuous Infusions:   Time spent: 25 min   LOS: 1 day   Charlynne Cousins  [facility] Pager [phone]  *Please refer to [email or web address], password [email or web address] to get updated schedule on who will round on this patient, as hospitalists switch teams weekly. If 7PM-7AM, please contact night-coverage at [email or web address], password [email or web address] for any overnight needs.  11/24/2015, 10:31 AM

## 2015-11-25 DIAGNOSIS — I10 Essential (primary) hypertension: Secondary | ICD-10-CM | POA: Diagnosis not present

## 2015-11-25 DIAGNOSIS — J189 Pneumonia, unspecified organism: Secondary | ICD-10-CM | POA: Diagnosis not present

## 2015-11-25 DIAGNOSIS — R509 Fever, unspecified: Secondary | ICD-10-CM | POA: Diagnosis not present

## 2015-11-25 MED ORDER — POLYETHYLENE GLYCOL 3350 17 G PO PACK
17.0000 g | PACK | Freq: Two times a day (BID) | ORAL | Status: DC
  Administered 2015-11-25 – 2015-11-26 (×2): 17 g via ORAL
  Filled 2015-11-25 (×2): qty 1

## 2015-11-25 MED ORDER — DEXTROSE 5 % IV SOLN
2.0000 g | Freq: Two times a day (BID) | INTRAVENOUS | Status: DC
  Administered 2015-11-25 – 2015-11-26 (×2): 2 g via INTRAVENOUS
  Filled 2015-11-25 (×4): qty 2

## 2015-11-25 NOTE — Progress Notes (Signed)
[facility] PROGRESS NOTE    Progress Note   Angelica Torres [id number] DOB: [birth year] DOA: 12-13-15 PCP: Glenda Chroman., MD   Brief Narrative:   Angelica Torres is an 82 y.o. female past medical history of hypertension stroke recent influenza pneumonia that comes in for weakness and fevers and started the day of admission. She's also been complaining of a productive cough.  Assessment/Plan:   HCAP (healthcare-associated pneumonia)/Fever/Cough Has remained afebrile in the last 24 hours. Continue empiric treatment with IV vancomycin and cefepime for an additional 24 hours if she continues to remain afebrile can switch to an oral regimen. Awaiting PT consult as swallowing evaluation.  Thyroid disease: Continue Synthroid.  History of stroke: Continue aspirin.  Essential hypertension Controlled today.  Constipation: Started on MiraLAX twice a day.    DVT Prophylaxis - Lovenox ordered.  Family Communication: daughter Disposition Plan: D/c in 2-3 days Code Status:     Code Status Orders        Start     Ordered   2015/12/13 1149  Full code   Continuous     12-13-15 1148    Code Status History    Date Active Date Inactive Code Status Order ID Comments User Context   11/03/2015  6:46 PM 11/07/2015 11:19 AM Full Code [id number]  Barton Dubois, MD Inpatient    Advance Directive Documentation        Most Recent Value   Type of Advance Directive  Healthcare Power of Attorney   Pre-existing out of facility DNR order (yellow form or pink MOST form)     "MOST" Form in Place?          IV Access:    Peripheral IV   Procedures and diagnostic studies:   Ct Chest Wo Contrast  December 13, 2015  CLINICAL DATA:  Weakness and cough. Atelectasis versus worsening bibasilar infiltrates by chest x-ray. EXAM: CT CHEST WITHOUT CONTRAST TECHNIQUE: Multidetector CT imaging of the chest was performed following the standard protocol without IV contrast. COMPARISON:  Chest x-ray  earlier today. FINDINGS: Radiographic findings were likely largely due to atelectasis as there is no evidence by CT of bibasilar airspace disease. There are scattered areas of subsegmental atelectasis and scarring in both lungs as well as potential component of subtle chronic interstitial lung disease. No edema, airspace consolidation, pneumothorax, nodule or lymphadenopathy is seen. The thoracic aorta is of normal caliber and demonstrates calcified atherosclerotic plaque at the level of the aortic arch. The heart size is normal. No pleural or pericardial fluid is identified. There is a small to moderate-sized hiatal hernia. IMPRESSION: No evidence of pulmonary infiltrate. Scattered scarring, atelectasis and probable component of interstitial lung disease present. Electronically Signed   By: Aletta Edouard M.D.   On: 12/13/15 15:31     Medical Consultants:    None.  Anti-Infectives:   Anti-infectives    Start     Dose/Rate Route Frequency Ordered Stop   11/25/15 2200  ceFEPIme (MAXIPIME) 2 g in dextrose 5 % 50 mL IVPB     2 g 100 mL/hr over 30 Minutes Intravenous Every 12 hours 11/25/15 1018 12/01/15 2159   12/13/2015 2200  ceFEPIme (MAXIPIME) 2 g in dextrose 5 % 50 mL IVPB  Status:  Discontinued     2 g 100 mL/hr over 30 Minutes Intravenous Every 24 hours 12/13/15 1148 11/25/15 1018   12/13/2015 2200  vancomycin (VANCOCIN) IVPB 750 mg/150 ml premix     750 mg 150 mL/hr over 60 Minutes Intravenous Every  12 hours 11/23/15 1159     11/23/15 1200  oseltamivir (TAMIFLU) capsule 30 mg  Status:  Discontinued     30 mg Oral 2 times daily 11/23/15 1148 11/24/15 1032   11/23/15 1100  vancomycin (VANCOCIN) 1,500 mg in sodium chloride 0.9 % 500 mL IVPB     1,500 mg 250 mL/hr over 120 Minutes Intravenous  Once 11/23/15 1006 11/23/15 1253   11/23/15 1000  ceFEPIme (MAXIPIME) 1 g in dextrose 5 % 50 mL IVPB     1 g 100 mL/hr over 30 Minutes Intravenous  Once 11/23/15 0954 11/23/15 1038       Subjective:    Angelica Torres she relates she feels much better than yesterday. In a good mood and  attitude she is also very hungry.  Objective:    Filed Vitals:   11/24/15 1501 11/24/15 2036 11/24/15 2117 11/25/15 0513  BP: 164/65 138/56  133/62  Pulse: 102 31 71 102  Temp: 102.5 F (39.2 C) 99 F (37.2 C)  99.6 F (37.6 C)  TempSrc: Oral Oral  Oral  Resp: 20  20 20   Height:      Weight:      SpO2: 96%  93% 93%    Intake/Output Summary (Last 24 hours) at 11/25/15 1100 Last data filed at 11/25/15 1042  Gross per 24 hour  Intake    720 ml  Output      3 ml  Net    717 ml   Filed Weights   11/23/15 0716 11/23/15 1149  Weight: 93.441 kg (206 lb) 93.441 kg (206 lb)    Exam: Gen:  NAD Cardiovascular:  RRR. Chest and lungs:   Good air movement clear to auscultation. Abdomen:  Abdomen soft, NT/ND, + BS Extremities:  No edema   Data Reviewed:    Labs: Basic Metabolic Panel:  Recent Labs Lab 11/23/15 0753 11/24/15 0611  NA 138 141  K 4.3 4.1  CL 104 108  CO2 26 27  GLUCOSE 119* 106*  BUN 16 10  CREATININE 0.91 0.73  CALCIUM 9.2 9.1   GFR Estimated Creatinine Clearance: 54.9 mL/min (by C-G formula based on Cr of 0.73). Liver Function Tests: No results for input(s): AST, ALT, ALKPHOS, BILITOT, PROT, ALBUMIN in the last 168 hours. No results for input(s): LIPASE, AMYLASE in the last 168 hours. No results for input(s): AMMONIA in the last 168 hours. Coagulation profile No results for input(s): INR, PROTIME in the last 168 hours.  CBC:  Recent Labs Lab 11/23/15 0753 11/24/15 0611  WBC 8.2 6.3  NEUTROABS 6.6  --   HGB 11.7* 10.6*  HCT 37.0 33.6*  MCV 93.0 92.6  PLT 143* 130*   Cardiac Enzymes:  Recent Labs Lab 11/23/15 0753  TROPONINI <0.03   BNP (last 3 results) No results for input(s): PROBNP in the last 8760 hours. CBG: No results for input(s): GLUCAP in the last 168 hours. D-Dimer: No results for input(s): DDIMER in the last  72 hours. Hgb A1c: No results for input(s): HGBA1C in the last 72 hours. Lipid Profile: No results for input(s): CHOL, HDL, LDLCALC, TRIG, CHOLHDL, LDLDIRECT in the last 72 hours. Thyroid function studies: No results for input(s): TSH, T4TOTAL, T3FREE, THYROIDAB in the last 72 hours.  Invalid input(s): FREET3 Anemia work up: No results for input(s): VITAMINB12, FOLATE, FERRITIN, TIBC, IRON, RETICCTPCT in the last 72 hours. Sepsis Labs:  Recent Labs Lab 11/23/15 0753 11/23/15 1007 11/24/15 0611  WBC 8.2  --  6.3  LATICACIDVEN 1.7 1.0  --    Microbiology Recent Results (from the past 240 hour(s))  Urine culture     Status: None (Preliminary result)   Collection Time: 11/23/15  8:46 AM  Result Value Ref Range Status   Specimen Description URINE, CATHETERIZED  Final   Special Requests NONE  Final   Culture   Final    TOO YOUNG TO READ Performed at [facility]    Report Status PENDING  Incomplete  Culture, blood (routine x 2) Call MD if unable to obtain prior to antibiotics being given     Status: None (Preliminary result)   Collection Time: 11/23/15 11:50 AM  Result Value Ref Range Status   Specimen Description BLOOD LEFT HAND  Final   Special Requests BOTTLES DRAWN AEROBIC AND ANAEROBIC 8 CC EACH  Final   Culture NO GROWTH 2 DAYS  Final   Report Status PENDING  Incomplete  Culture, blood (routine x 2) Call MD if unable to obtain prior to antibiotics being given     Status: None (Preliminary result)   Collection Time: 11/23/15 12:49 PM  Result Value Ref Range Status   Specimen Description BLOOD LEFT ARM  Final   Special Requests   Final    BOTTLES DRAWN AEROBIC AND ANAEROBIC 8 CC AEB AND 5 CC ANA   Culture NO GROWTH 2 DAYS  Final   Report Status PENDING  Incomplete     Medications:   . aspirin EC  325 mg Oral Daily  . budesonide  0.25 mg Nebulization BID  . Carbidopa-Levodopa ER  1 tablet Oral TID  . ceFEPime (MAXIPIME) IV  2 g Intravenous Q12H  .  citalopram  40 mg Oral Daily  . clonazePAM  0.5 mg Oral BID  . darifenacin  7.5 mg Oral Daily  . docusate sodium  100 mg Oral Daily  . fluticasone furoate-vilanterol  1 puff Inhalation BID  . guaiFENesin  600 mg Oral BID  . heparin  5,000 Units Subcutaneous 3 times per day  . levothyroxine  88 mcg Oral QAC breakfast  . pramipexole  0.25 mg Oral TID  . saccharomyces boulardii  250 mg Oral BID  . simvastatin  20 mg Oral Daily  . sodium chloride flush  3 mL Intravenous Q12H  . vancomycin  750 mg Intravenous Q12H   Continuous Infusions:   Time spent: 15 min   LOS: 2 days   Charlynne Cousins  [facility] Pager [phone]  *Please refer to [email or web address], password [email or web address] to get updated schedule on who will round on this patient, as hospitalists switch teams weekly. If 7PM-7AM, please contact night-coverage at [email or web address], password [email or web address] for any overnight needs.  11/25/2015, 11:00 AM

## 2015-11-26 DIAGNOSIS — J440 Chronic obstructive pulmonary disease with acute lower respiratory infection: Secondary | ICD-10-CM

## 2015-11-26 DIAGNOSIS — E079 Disorder of thyroid, unspecified: Secondary | ICD-10-CM | POA: Diagnosis not present

## 2015-11-26 DIAGNOSIS — J849 Interstitial pulmonary disease, unspecified: Secondary | ICD-10-CM | POA: Diagnosis not present

## 2015-11-26 DIAGNOSIS — I10 Essential (primary) hypertension: Secondary | ICD-10-CM | POA: Diagnosis not present

## 2015-11-26 LAB — BASIC METABOLIC PANEL
Anion gap: 7 (ref 5–15)
BUN: 9 mg/dL (ref 6–20)
CALCIUM: 9 mg/dL (ref 8.9–10.3)
CHLORIDE: 106 mmol/L (ref 101–111)
CO2: 26 mmol/L (ref 22–32)
CREATININE: 0.72 mg/dL (ref 0.44–1.00)
GFR calc Af Amer: >60 mL/min (ref >60)
GFR calc non Af Amer: >60 mL/min (ref >60)
Glucose, Bld: 105 mg/dL — ABNORMAL HIGH (ref 65–99)
Potassium: 3.8 mmol/L (ref 3.5–5.1)
SODIUM: 139 mmol/L (ref 135–145)

## 2015-11-26 LAB — VANCOMYCIN, TROUGH: VANCOMYCIN TR: 17 ug/mL (ref 10.0–20.0)

## 2015-11-26 NOTE — Evaluation (Addendum)
Physical Therapy Evaluation Patient Details Name: Angelica Torres MRN: [id number] DOB: 05/06/1926 Today's Date: 11/26/2015   History of Present Illness  Angelica Torres is a 82 y.o. female with past medical history significant for hypertension, history of stroke, and a recent influenza infection that comes in for productive cough that has not improved and fever that started this morning. As per family members with fevers of 101 this morning. She's had a productive cough that has not improved. Also this morning she was slightly confused and with worsening bilateral weakness. And she cannot get off the commode. As per patient she does feel mildly short of breath no nausea or vomiting or chest pain, she denies that she is just so weak that she doesn't feel like moving. No new medications or changes in medication.  Clinical Impression  Pt is nonambulatory    Follow Up Recommendations Home health PT    Equipment Recommendations  None recommended by PT    Recommendations for Other Services   none    Precautions / Restrictions Precautions Precautions: None Restrictions Weight Bearing Restrictions: No      Mobility  Bed Mobility Overal bed mobility: Needs Assistance Bed Mobility: Supine to Sit     Supine to sit: Min assist        Transfers Overall transfer level: Needs assistance Equipment used: Rolling walker (2 wheeled) Transfers: Sit to/from [employer] Sit to Stand: Min guard Stand pivot transfers: Min guard       General transfer comment: Pt states that she normally transfers in a flexed positon looking at the floor if she doesn't she loses her balance.  Ambulation/Gait  N/A              Stairs   N/A                   Pertinent Vitals/Pain Pain Assessment: No/denies pain    Home Living Family/patient expects to be discharged to:: Private residence Living Arrangements: Children Available Help at Discharge: Family Type of Home:  House Home Access: Ramped entrance     Home Layout: One level Home Equipment: Environmental consultant - 2 wheels;Walker - 4 wheels;Wheelchair - power;Wheelchair - manual;Bedside commode      Prior Function Level of Independence: Needs assistance   Gait / Transfers Assistance Needed: does not ambulate at baseline; only standing pivot transfers at baseline.               Extremity/Trunk Assessment               Lower Extremity Assessment: Generalized weakness         Communication      Cognition Arousal/Alertness: Awake/alert Behavior During Therapy: WFL for tasks assessed/performed Overall Cognitive Status: Within Functional Limits for tasks assessed                      General Comments      Exercises        Assessment/Plan    PT Assessment Patient needs continued PT services  PT Diagnosis Generalized weakness   PT Problem List Decreased strength;Decreased activity tolerance;Decreased mobility  PT Treatment Interventions Functional mobility training;Therapeutic activities;Therapeutic exercise;Balance training;Patient/family education   PT Goals (Current goals can be found in the Care Plan section) Acute Rehab PT Goals Time For Goal Achievement: 11/29/15 Potential to Achieve Goals: Good    Frequency Min 3X/week   Barriers to discharge           End of Session Equipment  Utilized During Treatment: Gait belt Activity Tolerance: Patient tolerated treatment well Patient left: in chair;with call bell/phone within reach;with family/visitor present Nurse Communication: Mobility status    Functional Assessment Tool Used: Clinical Judgment  Functional Limitation: Changing and maintaining body position Changing and Maintaining Body Position Current Status [id number]): At least 20 percent but less than 40 percent impaired, limited or restricted Changing and Maintaining Body Position Goal Status [id number]): At least 20 percent but less than 40 percent impaired, limited or  restricted Changing and Maintaining Body Position Discharge Status [phone]): At least 20 percent but less than 40 percent impaired, limited or restricted    Time: 1000-1032 PT Time Calculation (min) (ACUTE ONLY): 32 min   Charges:   PT Evaluation $PT Eval Moderate Complexity: 1 Procedure     PT G Codes:   PT G-Codes **NOT FOR INPATIENT CLASS** Functional Assessment Tool Used: Clinical Judgment  Functional Limitation: Changing and maintaining body position Changing and Maintaining Body Position Current Status [id number]): At least 20 percent but less than 40 percent impaired, limited or restricted Changing and Maintaining Body Position Goal Status [id number]): At least 20 percent but less than 40 percent impaired, limited or restricted Changing and Maintaining Body Position Discharge Status [phone]): At least 20 percent but less than 40 percent impaired, limited or restricted    Rayetta Humphrey, PT CLT [phone] 11/26/2015, 11:23 AM

## 2015-11-26 NOTE — Progress Notes (Signed)
Pharmacy Antibiotic Note  Angelica Torres is a 82 y.o. female admitted on 11/23/2015 with pneumonia.  Pharmacy has been consulted for VANCOMYCIN dosing.  Trough level on target.  Blood cx pending.    Plan:  Continue Vancomycin 750mg  IV q12hrs  Continue Cefepime 2gm IV q12hrs per MD  Check vanc trough level weekly or sooner if warranted  Deescalate ABX when improved / appropriate  Monitor labs, renal fxn, progress and c/s  Height: 5\' 4"  (162.6 cm) Weight: 206 lb (93.441 kg) IBW/kg (Calculated) : 54.7  Temp (24hrs), Avg:99.7 F (37.6 C), Min:99.1 F (37.3 C), Max:100.2 F (37.9 C)   Recent Labs Lab 11/23/15 0753 11/23/15 1007 11/24/15 0611 11/26/15 0609 11/26/15 0937  WBC 8.2  --  6.3  --   --   CREATININE 0.91  --  0.73 0.72  --   LATICACIDVEN 1.7 1.0  --   --   --   VANCOTROUGH  --   --   --   --  17    Estimated Creatinine Clearance: 54.9 mL/min (by C-G formula based on Cr of 0.72).    Allergies  Allergen Reactions  . Sulfa Antibiotics Other (See Comments)    Reaction unknown to patient  . Codeine Rash   Anti-infectives    Start     Dose/Rate Route Frequency Ordered Stop   11/25/15 2200  ceFEPIme (MAXIPIME) 2 g in dextrose 5 % 50 mL IVPB     2 g 100 mL/hr over 30 Minutes Intravenous Every 12 hours 11/25/15 1018 12/01/15 2159   11/23/15 2200  ceFEPIme (MAXIPIME) 2 g in dextrose 5 % 50 mL IVPB  Status:  Discontinued     2 g 100 mL/hr over 30 Minutes Intravenous Every 24 hours 11/23/15 1148 11/25/15 1018   11/23/15 2200  vancomycin (VANCOCIN) IVPB 750 mg/150 ml premix     750 mg 150 mL/hr over 60 Minutes Intravenous Every 12 hours 11/23/15 1159     11/23/15 1200  oseltamivir (TAMIFLU) capsule 30 mg  Status:  Discontinued     30 mg Oral 2 times daily 11/23/15 1148 11/24/15 1032   11/23/15 1100  vancomycin (VANCOCIN) 1,500 mg in sodium chloride 0.9 % 500 mL IVPB     1,500 mg 250 mL/hr over 120 Minutes Intravenous  Once 11/23/15 1006 11/23/15 1253   11/23/15  1000  ceFEPIme (MAXIPIME) 1 g in dextrose 5 % 50 mL IVPB     1 g 100 mL/hr over 30 Minutes Intravenous  Once 11/23/15 0954 11/23/15 1038     Dose adjustments this admission: none  Recent Results (from the past 240 hour(s))  Urine culture     Status: None (Preliminary result)   Collection Time: 11/23/15  8:46 AM  Result Value Ref Range Status   Specimen Description URINE, CATHETERIZED  Final   Special Requests NONE  Final   Culture   Final    30,000 COLONIES/mL GRAM NEGATIVE RODS Performed at [facility]    Report Status PENDING  Incomplete  Culture, blood (routine x 2) Call MD if unable to obtain prior to antibiotics being given     Status: None (Preliminary result)   Collection Time: 11/23/15 11:50 AM  Result Value Ref Range Status   Specimen Description BLOOD LEFT HAND  Final   Special Requests BOTTLES DRAWN AEROBIC AND ANAEROBIC 8 CC EACH  Final   Culture NO GROWTH 3 DAYS  Final   Report Status PENDING  Incomplete  Culture, blood (routine x 2)  Call MD if unable to obtain prior to antibiotics being given     Status: None (Preliminary result)   Collection Time: 11/23/15 12:49 PM  Result Value Ref Range Status   Specimen Description BLOOD LEFT ARM  Final   Special Requests   Final    BOTTLES DRAWN AEROBIC AND ANAEROBIC 8 CC AEB AND 5 CC ANA   Culture NO GROWTH 3 DAYS  Final   Report Status PENDING  Incomplete    Thank you for allowing pharmacy to be a part of this patient's care.  Hart Robinsons A 11/26/2015 10:50 AM

## 2015-11-26 NOTE — Progress Notes (Signed)
Discharge instructions given, verbalized understanding, out in stable condition via w/c with staff. 

## 2015-11-26 NOTE — Discharge Summary (Signed)
Physician Discharge Summary  Angelica Torres [id number] DOB: [birth year] DOA: 11/23/2015  PCP: Glenda Chroman., MD  Admit date: 11/23/2015 Discharge date: 11/26/2015  Time spent: >35 minutes  Recommendations for Outpatient Follow-up:  PCP in 1 week as needed Pulmonology in 2-3 weeks   Discharge Diagnoses:  Active Problems:   Thyroid disease   History of stroke   Essential hypertension   HCAP (healthcare-associated pneumonia)   Fever   Cough   Healthcare associated bacterial pneumonia   Discharge Condition: stable   Diet recommendation: low sodium   Filed Weights   11/23/15 0716 11/23/15 1149  Weight: 93.441 kg (206 lb) 93.441 kg (206 lb)    History of present illness:  82 y/o female with PMH of HTN, CVA, COPD, Hypothyroidism, Parkinson's disease(not ambulatory at baseline), recent Influenza presented with generalized weakness, subjective fever. On admission, thought possible pneumonia based on CXR.    Hospital Course:  1. COPD, chronic bronchitis. Initial CXR showed possible infiltrate. But CT chest: no infiltrates. Patient received IV atx (vanc/cefepime) for 3 days. She remained afebrile, no leukocytosis. Blood cultures: NGTD. No evidence of pneumonia.  Discontinued antibiotics . -Probable ILD, recommended to f/u with pulmonology as outpatient after discharge. Cont bronchodilators as needed. Respiratory exam is unremarkable   2. Hypothyroidism, cont levothyroxine.  3. Parkinson's disease(not ambulatory at baseline). Neuro exam no focal      Procedures:  none (i.e. Studies not automatically included, echos, thoracentesis, etc; not x-rays)  Consultations:  none  Discharge Exam: Filed Vitals:   11/25/15 1954 11/25/15 2225  BP:  155/64  Pulse: 74 88  Temp:  99.1 F (37.3 C)  Resp: 18 20    General: comfortably, no distress  Cardiovascular: s1,s2 rrr Respiratory: no wheezing   Discharge Instructions  Discharge Instructions    Diet - low sodium heart  healthy    Complete by:  As directed      Discharge instructions    Complete by:  As directed   Please follow up with primary care doctor in 1 week     Increase activity slowly    Complete by:  As directed             Medication List    STOP taking these medications        budesonide 0.25 MG/2ML nebulizer solution  Commonly known as:  PULMICORT     multivitamin with minerals Tabs tablet     predniSONE 20 MG tablet  Commonly known as:  DELTASONE      TAKE these medications        albuterol (2.5 MG/3ML) 0.083% nebulizer solution  Commonly known as:  PROVENTIL  Take 3 mLs (2.5 mg total) by nebulization every 4 (four) hours as needed for wheezing or shortness of breath.     aspirin EC 325 MG tablet  Take 1 tablet (325 mg total) by mouth daily.     benazepril 40 MG tablet  Commonly known as:  LOTENSIN  Take 1 tablet by mouth daily.     beta carotene w/minerals tablet  Take 1 tablet by mouth daily.     BREO ELLIPTA 100-25 MCG/INH Aepb  Generic drug:  fluticasone furoate-vilanterol  Inhale 1 puff into the lungs 2 (two) times daily.     Carbidopa-Levodopa ER 25-100 MG tablet controlled release  Commonly known as:  SINEMET CR  Take 1 tablet by mouth 3 (three) times daily.     citalopram 40 MG tablet  Commonly known as:  CELEXA  Take 1  tablet by mouth daily.     clonazePAM 0.5 MG tablet  Commonly known as:  KLONOPIN  Take 1 tablet by mouth 2 (two) times daily.     docusate sodium 100 MG capsule  Commonly known as:  COLACE  Take 100 mg by mouth daily.     FISH OIL + D3 PO  Take 1 capsule by mouth daily.     folic acid 1 MG tablet  Commonly known as:  FOLVITE  Take 1 mg by mouth daily.     furosemide 20 MG tablet  Commonly known as:  LASIX  Take 10 tablets by mouth daily.     guaiFENesin 600 MG 12 hr tablet  Commonly known as:  MUCINEX  Take 1 tablet (600 mg total) by mouth 2 (two) times daily.     HYDROcodone-acetaminophen 5-325 MG tablet  Commonly  known as:  NORCO/VICODIN  Take 1 tablet by mouth every 6 (six) hours as needed for severe pain.     levothyroxine 88 MCG tablet  Commonly known as:  SYNTHROID, LEVOTHROID  Take 1 tablet by mouth daily.     meclizine 12.5 MG tablet  Commonly known as:  ANTIVERT  Take 1 tablet by mouth 3 (three) times daily as needed for dizziness.     pramipexole 0.25 MG tablet  Commonly known as:  MIRAPEX  Take 1 tablet by mouth 3 (three) times daily.     saccharomyces boulardii 250 MG capsule  Commonly known as:  FLORASTOR  Take 1 capsule (250 mg total) by mouth 2 (two) times daily.     simvastatin 20 MG tablet  Commonly known as:  ZOCOR  Take 1 tablet by mouth daily.     VESICARE 5 MG tablet  Generic drug:  solifenacin  Take 1 tablet by mouth daily.       Allergies  Allergen Reactions  . Sulfa Antibiotics Other (See Comments)    Reaction unknown to patient  . Codeine Rash      The results of significant diagnostics from this hospitalization (including imaging, microbiology, ancillary and laboratory) are listed below for reference.    Significant Diagnostic Studies: Dg Chest 2 View  11/03/2015  CLINICAL DATA:  Shortness of breath, wheezing today EXAM: CHEST  2 VIEW COMPARISON:  02/09/2014 FINDINGS: Mild cardiomegaly. No confluent airspace opacities. No effusions or acute bony abnormality. IMPRESSION: Cardiomegaly.  No active disease. Electronically Signed   By: Rolm Baptise M.D.   On: 11/03/2015 11:42   Ct Chest Wo Contrast  11/23/2015  CLINICAL DATA:  Weakness and cough. Atelectasis versus worsening bibasilar infiltrates by chest x-ray. EXAM: CT CHEST WITHOUT CONTRAST TECHNIQUE: Multidetector CT imaging of the chest was performed following the standard protocol without IV contrast. COMPARISON:  Chest x-ray earlier today. FINDINGS: Radiographic findings were likely largely due to atelectasis as there is no evidence by CT of bibasilar airspace disease. There are scattered areas of  subsegmental atelectasis and scarring in both lungs as well as potential component of subtle chronic interstitial lung disease. No edema, airspace consolidation, pneumothorax, nodule or lymphadenopathy is seen. The thoracic aorta is of normal caliber and demonstrates calcified atherosclerotic plaque at the level of the aortic arch. The heart size is normal. No pleural or pericardial fluid is identified. There is a small to moderate-sized hiatal hernia. IMPRESSION: No evidence of pulmonary infiltrate. Scattered scarring, atelectasis and probable component of interstitial lung disease present. Electronically Signed   By: Aletta Edouard M.D.   On: 11/23/2015 15:31  Dg Chest Portable 1 View  11/23/2015  CLINICAL DATA:  Generalized weakness, cough and fever. EXAM: PORTABLE CHEST 1 VIEW COMPARISON:  11/03/2015; 04/11/2014; 9611/20 13 FINDINGS: Grossly unchanged enlarged cardiac silhouette and mediastinal contours with atherosclerotic plaque within the thoracic aorta. Decreased lung volumes with worsening bibasilar opacities, left greater than right. Trace bilateral effusions are not excluded. Pulmonary venous congestion without frank evidence of edema. No pneumothorax. Unchanged bones. IMPRESSION: 1. Decreased lung volumes with worsening bibasilar opacities, left greater than right, atelectasis versus infiltrate. Further evaluation with a PA and lateral chest radiograph may be obtained as clinically indicated. 2. Cardiomegaly and pulmonary venous congestion without frank evidence of edema. Electronically Signed   By: Sandi Mariscal M.D.   On: 11/23/2015 08:05    Microbiology: Recent Results (from the past 240 hour(s))  Urine culture     Status: None (Preliminary result)   Collection Time: 11/23/15  8:46 AM  Result Value Ref Range Status   Specimen Description URINE, CATHETERIZED  Final   Special Requests NONE  Final   Culture   Final    30,000 COLONIES/mL GRAM NEGATIVE RODS Performed at [facility]     Report Status PENDING  Incomplete  Culture, blood (routine x 2) Call MD if unable to obtain prior to antibiotics being given     Status: None (Preliminary result)   Collection Time: 11/23/15 11:50 AM  Result Value Ref Range Status   Specimen Description BLOOD LEFT HAND  Final   Special Requests BOTTLES DRAWN AEROBIC AND ANAEROBIC 8 CC EACH  Final   Culture NO GROWTH 3 DAYS  Final   Report Status PENDING  Incomplete  Culture, blood (routine x 2) Call MD if unable to obtain prior to antibiotics being given     Status: None (Preliminary result)   Collection Time: 11/23/15 12:49 PM  Result Value Ref Range Status   Specimen Description BLOOD LEFT ARM  Final   Special Requests   Final    BOTTLES DRAWN AEROBIC AND ANAEROBIC 8 CC AEB AND 5 CC ANA   Culture NO GROWTH 3 DAYS  Final   Report Status PENDING  Incomplete     Labs: Basic Metabolic Panel:  Recent Labs Lab 11/23/15 0753 11/24/15 0611 11/26/15 0609  NA 138 141 139  K 4.3 4.1 3.8  CL 104 108 106  CO2 26 27 26   GLUCOSE 119* 106* 105*  BUN 16 10 9   CREATININE 0.91 0.73 0.72  CALCIUM 9.2 9.1 9.0   Liver Function Tests: No results for input(s): AST, ALT, ALKPHOS, BILITOT, PROT, ALBUMIN in the last 168 hours. No results for input(s): LIPASE, AMYLASE in the last 168 hours. No results for input(s): AMMONIA in the last 168 hours. CBC:  Recent Labs Lab 11/23/15 0753 11/24/15 0611  WBC 8.2 6.3  NEUTROABS 6.6  --   HGB 11.7* 10.6*  HCT 37.0 33.6*  MCV 93.0 92.6  PLT 143* 130*   Cardiac Enzymes:  Recent Labs Lab 11/23/15 0753  TROPONINI <0.03   BNP: BNP (last 3 results)  Recent Labs  11/03/15 1124 11/23/15 1247  BNP 78.0 34.0    ProBNP (last 3 results) No results for input(s): PROBNP in the last 8760 hours.  CBG: No results for input(s): GLUCAP in the last 168 hours.     SignedKinnie Feil  [facility] 11/26/2015, 12:23 PM

## 2015-11-26 NOTE — Care Management Note (Signed)
Case Management Note  Patient Details  Name: Maryjoy Suggett MRN: [id number] Date of Birth: December 02, 1925  Subjective/Objective:                  Pt admitted with ? HCAP. Pt is from home, lives with her daughter. Pt not active with [facility] services prior to admission, has all necessary DME. Pt's daughter at bedside. Plans for pt to return home with her. Pt will need HH PT at DC. Pt would like [facility] services a she has used them in the past. Pt is aware HH has 48 hours to initiate services at DC.   Action/Plan: Pt discharging home today with [place], of [facility], made aware of referral and will obtain pt info from chart.   Expected Discharge Date:  11/26/15               Expected Discharge Plan:  [place]  In-House Referral:  NA  Discharge planning Services  CM Consult  Post Acute Care Choice:  Home Health Choice offered to:  Patient  DME Arranged:    DME Agency:     HH Arranged:  PT [place]:  [place]  Status of Service:  Completed, signed off  Medicare Important Message Given:  Other (see comment) (Not given pt changed to obs) Date Medicare IM Given:    Medicare IM give by:    Date Additional Medicare IM Given:    Additional Medicare Important Message give by:     If discussed at [place] of Stay Meetings, dates discussed:    Additional Comments:  Sherald Barge, RN 11/26/2015, 12:30 PM

## 2015-11-26 NOTE — Care Management Important Message (Signed)
Important Message  Patient Details  Name: Angelica Torres MRN: [id number] Date of Birth: [birth year]   Medicare Important Message Given:  Other (see comment) (Not given pt changed to obs)    Sherald Barge, RN 11/26/2015, 12:29 PM

## 2015-11-26 NOTE — Care Management Obs Status (Signed)
[place] NOTIFICATION   Patient Details  Name: Angelica Torres MRN: [id number] Date of Birth: 1926-01-21   Medicare Observation Status Notification Given:  Yes    Sherald Barge, RN 11/26/2015, 10:19 AM

## 2015-11-28 LAB — CULTURE, BLOOD (ROUTINE X 2)
CULTURE: NO GROWTH
CULTURE: NO GROWTH

## 2015-12-04 LAB — URINE CULTURE: Culture: 30000

## 2015-12-17 ENCOUNTER — Institutional Professional Consult (permissible substitution): Payer: Medicare Other | Admitting: Pulmonary Disease

## 2016-06-12 ENCOUNTER — Ambulatory Visit (INDEPENDENT_AMBULATORY_CARE_PROVIDER_SITE_OTHER): Payer: Medicare Other | Admitting: Otolaryngology

## 2016-06-12 ENCOUNTER — Other Ambulatory Visit (INDEPENDENT_AMBULATORY_CARE_PROVIDER_SITE_OTHER): Payer: Self-pay | Admitting: Otolaryngology

## 2016-06-12 DIAGNOSIS — R1312 Dysphagia, oropharyngeal phase: Secondary | ICD-10-CM

## 2016-06-12 DIAGNOSIS — D440 Neoplasm of uncertain behavior of thyroid gland: Secondary | ICD-10-CM | POA: Diagnosis not present

## 2016-06-12 DIAGNOSIS — E079 Disorder of thyroid, unspecified: Secondary | ICD-10-CM

## 2016-06-25 ENCOUNTER — Ambulatory Visit (HOSPITAL_COMMUNITY)
Admission: RE | Admit: 2016-06-25 | Discharge: 2016-06-25 | Disposition: A | Discharge status: Home / Self Care | Payer: Medicare Other | Source: Ambulatory Visit | Attending: Otolaryngology | Admitting: Otolaryngology

## 2016-06-25 DIAGNOSIS — I2510 Atherosclerotic heart disease of native coronary artery without angina pectoris: Secondary | ICD-10-CM | POA: Insufficient documentation

## 2016-06-25 DIAGNOSIS — J392 Other diseases of pharynx: Secondary | ICD-10-CM | POA: Insufficient documentation

## 2016-06-25 DIAGNOSIS — E049 Nontoxic goiter, unspecified: Secondary | ICD-10-CM | POA: Diagnosis not present

## 2016-06-25 DIAGNOSIS — I6523 Occlusion and stenosis of bilateral carotid arteries: Secondary | ICD-10-CM | POA: Diagnosis not present

## 2016-06-25 DIAGNOSIS — E079 Disorder of thyroid, unspecified: Secondary | ICD-10-CM

## 2016-06-25 LAB — POCT I-STAT CREATININE: Creatinine, Ser: 0.9 mg/dL (ref 0.44–1.00)

## 2016-06-25 MED ORDER — IOPAMIDOL (ISOVUE-300) INJECTION 61%
75.0000 mL | Freq: Once | INTRAVENOUS | Status: AC | PRN
  Administered 2016-06-25: 75 mL via INTRAVENOUS

## 2016-07-10 ENCOUNTER — Ambulatory Visit (INDEPENDENT_AMBULATORY_CARE_PROVIDER_SITE_OTHER): Payer: Medicare Other | Admitting: Otolaryngology

## 2016-07-10 DIAGNOSIS — D440 Neoplasm of uncertain behavior of thyroid gland: Secondary | ICD-10-CM | POA: Diagnosis not present

## 2016-07-10 DIAGNOSIS — R1312 Dysphagia, oropharyngeal phase: Secondary | ICD-10-CM

## 2016-09-22 ENCOUNTER — Ambulatory Visit (INDEPENDENT_AMBULATORY_CARE_PROVIDER_SITE_OTHER): Payer: Medicare Other | Admitting: Otolaryngology

## 2016-09-22 DIAGNOSIS — H6123 Impacted cerumen, bilateral: Secondary | ICD-10-CM | POA: Diagnosis not present

## 2016-09-22 DIAGNOSIS — H903 Sensorineural hearing loss, bilateral: Secondary | ICD-10-CM

## 2017-03-09 IMAGING — CT CT CHEST W/O CM
2 of 6 series · 15 of 36 positions shown, 19 images · non-contrast
Comparison: Chest x-ray earlier today.

CLINICAL DATA: Weakness and cough. Atelectasis versus worsening
bibasilar infiltrates by chest x-ray.

EXAM:
CT CHEST WITHOUT CONTRAST
TECHNIQUE: Multidetector CT imaging of the chest was performed following the
standard protocol without IV contrast.

[Series 3: chestroutine 5.0 b40f · axial · 0.66mm/px · z∈[-231,+69]mm · 14 of 67 slices shown, 18 images]
[im 4/67  mediastinal]
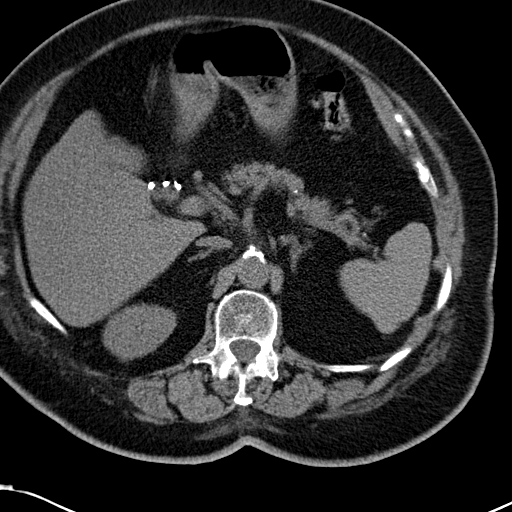
[im 4/67  lung]
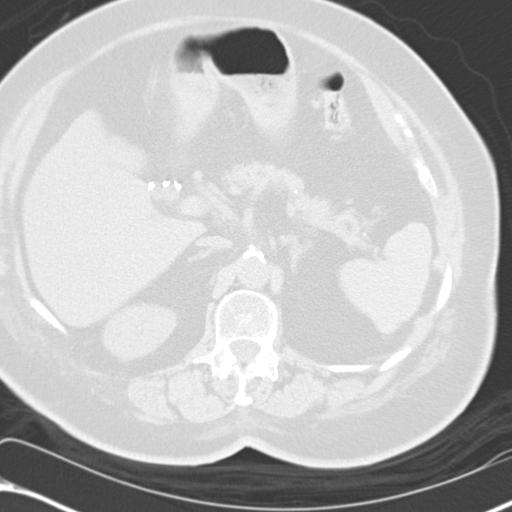
[im 10/67  lung]
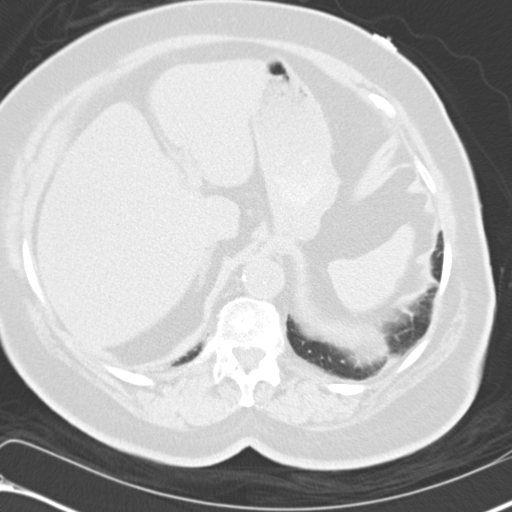
[im 13/67  lung]
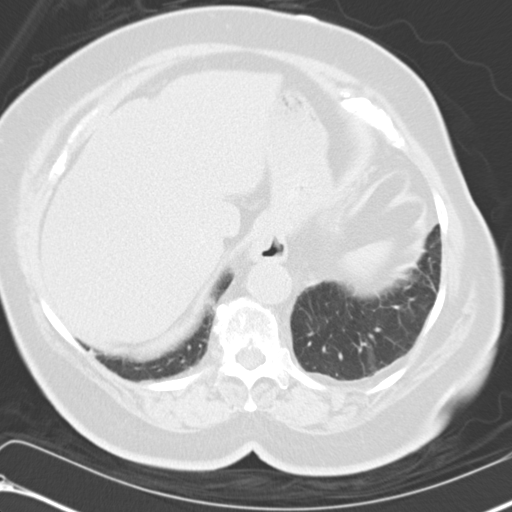
[im 19/67  lung]
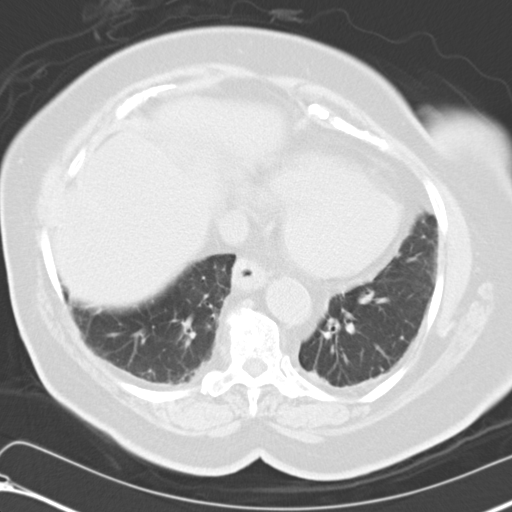
[im 22/67  mediastinal]
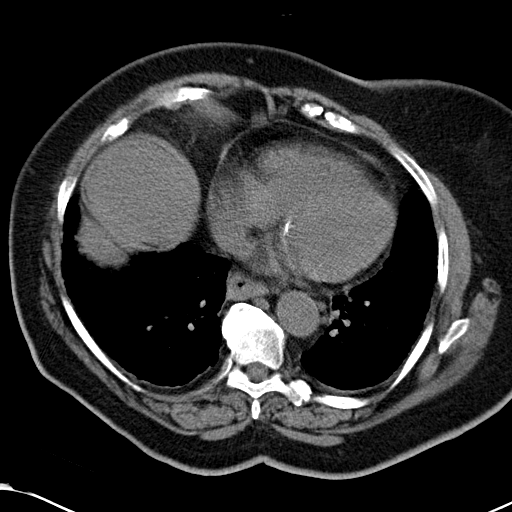
[im 22/67  lung]
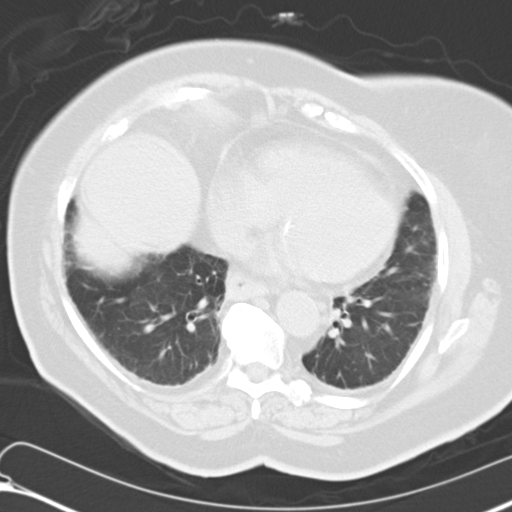
[im 28/67  lung]
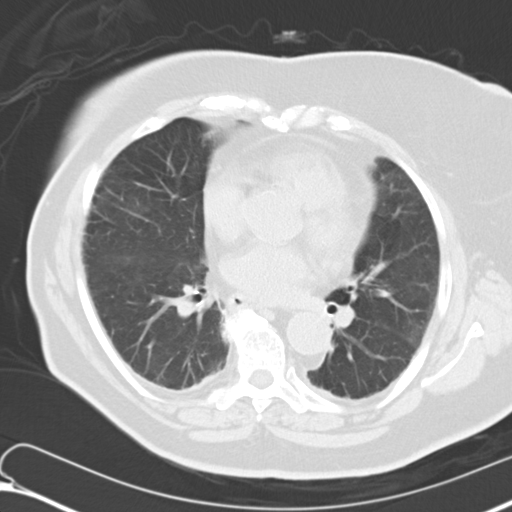
[im 31/67  lung]
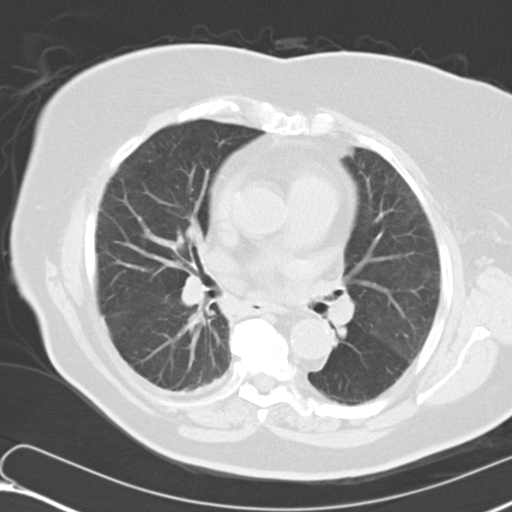
[im 37/67  lung]
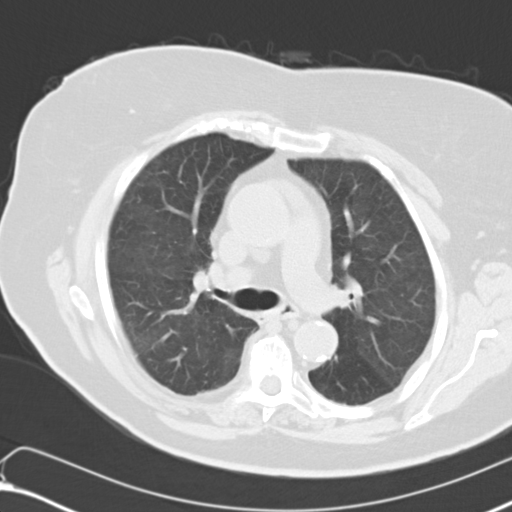
[im 40/67  mediastinal]
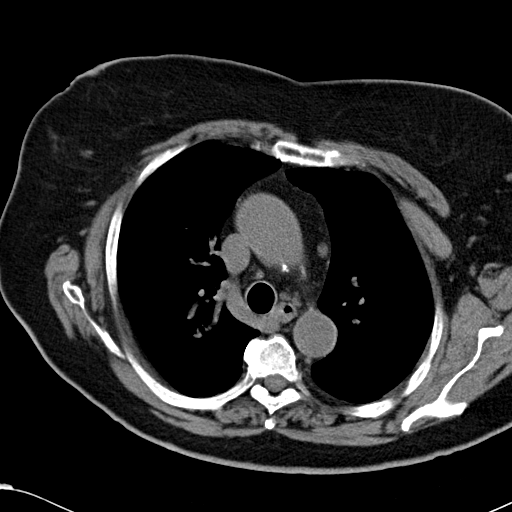
[im 40/67  lung]
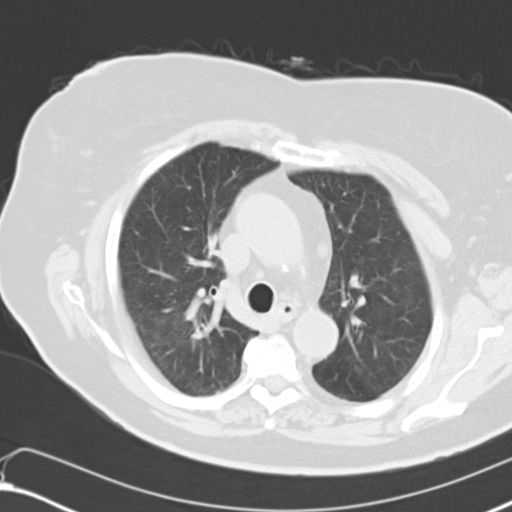
[im 46/67  lung]
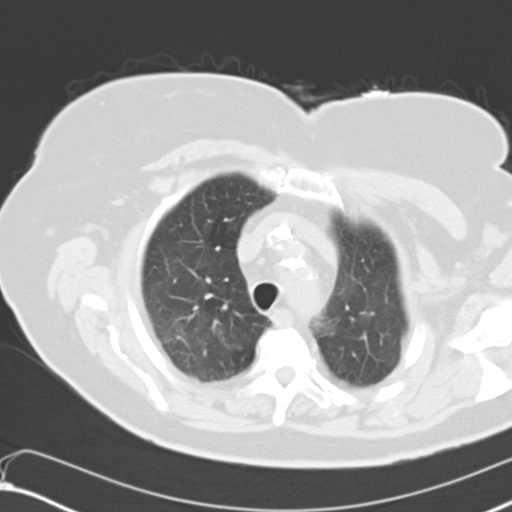
[im 49/67  lung]
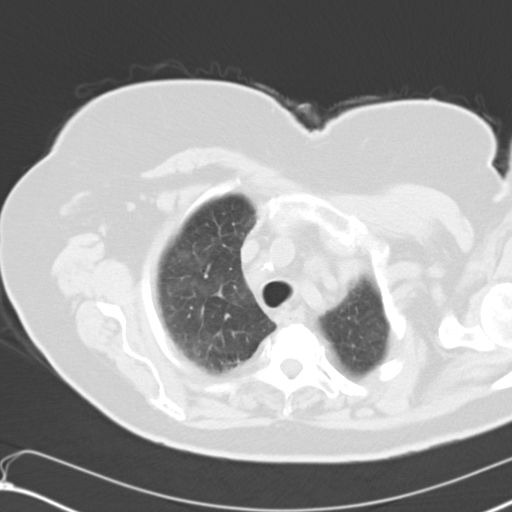
[im 55/67  lung]
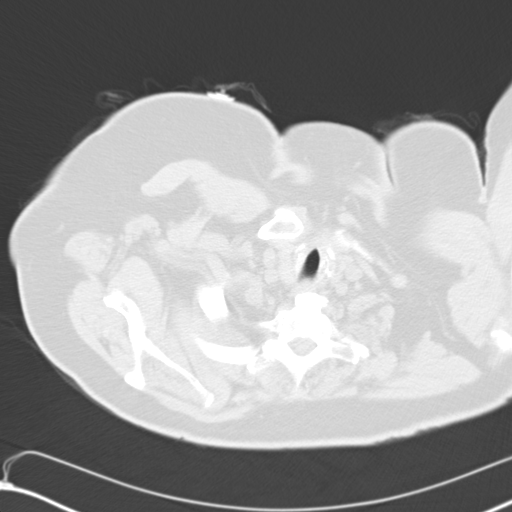
[im 58/67  mediastinal]
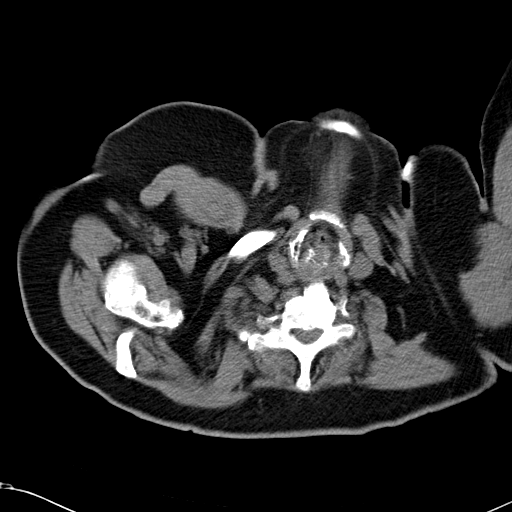
[im 58/67  lung]
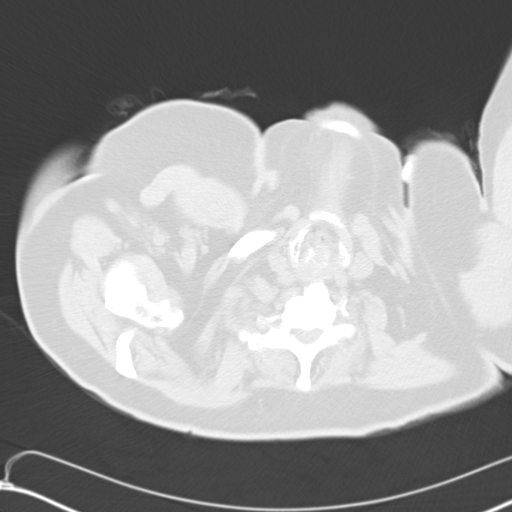
[im 64/67  lung]
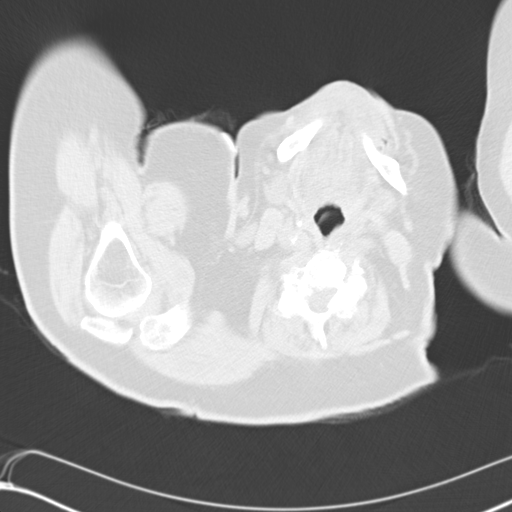

[Series 7: mpr coro 3mm · coronal · 0.64mm/px · 1 of 102 slices shown]
[im 51/102  lung]
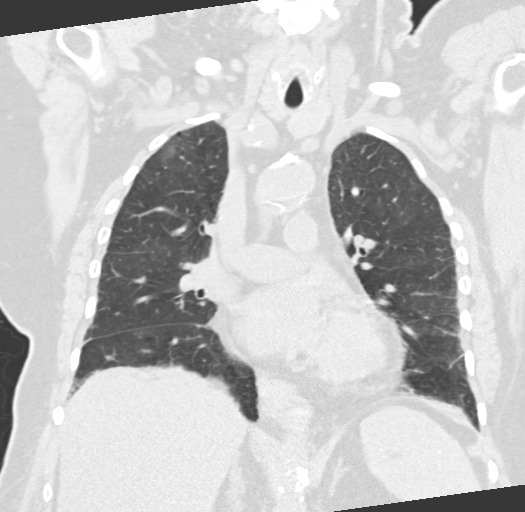

[15 of 36 positions shown; findings below may reference images not displayed]

FINDINGS: Radiographic findings were likely largely due to atelectasis as
there is no evidence by CT of bibasilar airspace disease. There are
scattered areas of subsegmental atelectasis and scarring in both
lungs as well as potential component of subtle chronic interstitial
lung disease. No edema, airspace consolidation, pneumothorax, nodule
or lymphadenopathy is seen. The thoracic aorta is of normal caliber
and demonstrates calcified atherosclerotic plaque at the level of
the aortic arch.

The heart size is normal. No pleural or pericardial fluid is
identified. There is a small to moderate-sized hiatal hernia.
IMPRESSION: No evidence of pulmonary infiltrate. Scattered scarring, atelectasis
and probable component of interstitial lung disease present.

## 2018-08-06 ENCOUNTER — Emergency Department (HOSPITAL_COMMUNITY): Payer: Medicare Other

## 2018-08-06 ENCOUNTER — Emergency Department (HOSPITAL_COMMUNITY)
Admission: EM | Admit: 2018-08-06 | Discharge: 2018-08-06 | Disposition: A | Discharge status: Home / Self Care | Payer: Medicare Other | Attending: Emergency Medicine | Admitting: Emergency Medicine

## 2018-08-06 ENCOUNTER — Encounter (HOSPITAL_COMMUNITY): Payer: Self-pay | Admitting: Emergency Medicine

## 2018-08-06 ENCOUNTER — Other Ambulatory Visit: Payer: Self-pay

## 2018-08-06 DIAGNOSIS — Z87891 Personal history of nicotine dependence: Secondary | ICD-10-CM | POA: Insufficient documentation

## 2018-08-06 DIAGNOSIS — R197 Diarrhea, unspecified: Secondary | ICD-10-CM | POA: Insufficient documentation

## 2018-08-06 DIAGNOSIS — I10 Essential (primary) hypertension: Secondary | ICD-10-CM | POA: Diagnosis not present

## 2018-08-06 DIAGNOSIS — Z79899 Other long term (current) drug therapy: Secondary | ICD-10-CM | POA: Insufficient documentation

## 2018-08-06 DIAGNOSIS — Z7982 Long term (current) use of aspirin: Secondary | ICD-10-CM | POA: Diagnosis not present

## 2018-08-06 LAB — LIPASE, BLOOD: Lipase: 24 U/L (ref 11–51)

## 2018-08-06 LAB — COMPREHENSIVE METABOLIC PANEL
ALT: 11 U/L (ref 0–44)
AST: 17 U/L (ref 15–41)
Albumin: 3.7 g/dL (ref 3.5–5.0)
Alkaline Phosphatase: 44 U/L (ref 38–126)
Anion gap: 7 (ref 5–15)
BILIRUBIN TOTAL: 0.6 mg/dL (ref 0.3–1.2)
BUN: 17 mg/dL (ref 8–23)
CO2: 26 mmol/L (ref 22–32)
Calcium: 9.7 mg/dL (ref 8.9–10.3)
Chloride: 109 mmol/L (ref 98–111)
Creatinine, Ser: 0.79 mg/dL (ref 0.44–1.00)
Glucose, Bld: 114 mg/dL — ABNORMAL HIGH (ref 70–99)
POTASSIUM: 4 mmol/L (ref 3.5–5.1)
Sodium: 142 mmol/L (ref 135–145)
TOTAL PROTEIN: 7.2 g/dL (ref 6.5–8.1)

## 2018-08-06 LAB — CBC WITH DIFFERENTIAL/PLATELET
ABS IMMATURE GRANULOCYTES: 0.02 10*3/uL (ref 0.00–0.07)
Basophils Absolute: 0 10*3/uL (ref 0.0–0.1)
Basophils Relative: 0 %
Eosinophils Absolute: 0.3 10*3/uL (ref 0.0–0.5)
Eosinophils Relative: 3 %
HEMATOCRIT: 38.7 % (ref 36.0–46.0)
HEMOGLOBIN: 11.8 g/dL — AB (ref 12.0–15.0)
Immature Granulocytes: 0 %
LYMPHS PCT: 8 %
Lymphs Abs: 0.8 10*3/uL (ref 0.7–4.0)
MCH: 28.4 pg (ref 26.0–34.0)
MCHC: 30.5 g/dL (ref 30.0–36.0)
MCV: 93 fL (ref 80.0–100.0)
MONO ABS: 0.7 10*3/uL (ref 0.1–1.0)
Monocytes Relative: 7 %
NEUTROS ABS: 8.4 10*3/uL — AB (ref 1.7–7.7)
Neutrophils Relative %: 82 %
PLATELETS: 172 10*3/uL (ref 150–400)
RBC: 4.16 MIL/uL (ref 3.87–5.11)
RDW: 14.9 % (ref 11.5–15.5)
WBC: 10.2 10*3/uL (ref 4.0–10.5)
nRBC: 0 % (ref 0.0–0.2)

## 2018-08-06 MED ORDER — SODIUM CHLORIDE 0.9 % IV SOLN
Freq: Once | INTRAVENOUS | Status: AC
  Administered 2018-08-06: 11:00:00 via INTRAVENOUS

## 2018-08-06 MED ORDER — ONDANSETRON HCL 4 MG/2ML IJ SOLN
4.0000 mg | Freq: Once | INTRAMUSCULAR | Status: AC
  Administered 2018-08-06: 4 mg via INTRAVENOUS
  Filled 2018-08-06: qty 2

## 2018-08-06 NOTE — ED Notes (Signed)
Pt taken to xray 

## 2018-08-06 NOTE — ED Notes (Signed)
Pt had small bm, pt cleaned

## 2018-08-06 NOTE — ED Notes (Signed)
Dr Lacinda Axon inserted one saline fleets enema

## 2018-08-06 NOTE — ED Notes (Signed)
[facility] sending truck to transport pt back to [facility] nursing center

## 2018-08-06 NOTE — Discharge Instructions (Addendum)
Work was good.  Increase fluids, fruits, fiber.  MiraLAX or magnesium citrate for constipation.

## 2018-08-06 NOTE — ED Triage Notes (Signed)
Diarrhea since 2am. Pt also reports throwing up once and reports greenish looking. Pt resident at [facility] rehab center.

## 2018-08-06 NOTE — ED Notes (Signed)
[facility] arrived to transport pt back to rehab center.

## 2018-08-09 NOTE — ED Provider Notes (Signed)
[facility] EMERGENCY DEPARTMENT Provider Note   CSN: [id number] Arrival date & time: 08/06/18  1002     History   Chief Complaint Chief Complaint  Patient presents with  . Diarrhea    HPI Angelica Torres is a 84 y.o. female.  Level 5 caveat for dementia.  Patient and nursing home report a combination of diarrhea and constipation.  Constipation is ongoing.  Diarrhea started 2 AM today.  She is a resident at [facility].  No fever, chills, abdominal pain, vomiting.     Past Medical History:  Diagnosis Date  . COPD (chronic obstructive pulmonary disease) ([place])   . High cholesterol   . Hypertension   . Influenza A 10/2015  . Parkinson disease ([place])   . Stroke ([place])   . Thyroid disease     Patient Active Problem List   Diagnosis Date Noted  . HCAP (healthcare-associated pneumonia) 11/23/2015  . Fever 11/23/2015  . Cough 11/23/2015  . Healthcare associated bacterial pneumonia 11/23/2015  . Bronchitis 11/03/2015  . Influenza A with respiratory manifestations 11/03/2015  . SOB (shortness of breath) 11/03/2015  . Thyroid disease 11/03/2015  . History of stroke 11/03/2015  . Essential hypertension 11/03/2015  . Hyperlipidemia 11/03/2015  . Anxiety and depression 11/03/2015  . Parkinson disease ([place]) 11/03/2015    Past Surgical History:  Procedure Laterality Date  . BACK SURGERY    . CHOLECYSTECTOMY    . kidney suregery       OB History   None      Home Medications    Prior to Admission medications   Medication Sig Start Date End Date Taking? Authorizing Provider  acetaminophen (TYLENOL) 650 MG CR tablet Take 650 mg by mouth every 8 (eight) hours as needed for pain.   Yes [provider]  albuterol (PROVENTIL) (2.5 MG/3ML) 0.083% nebulizer solution Take 3 mLs (2.5 mg total) by nebulization every 4 (four) hours as needed for wheezing or shortness of breath. 11/05/15  Yes Barton Dubois, MD  aspirin EC 325 MG tablet Take 1 tablet (325  mg total) by mouth daily. 11/05/15  Yes Barton Dubois, MD  baclofen (LIORESAL) 10 MG tablet Take 10 mg by mouth as needed for muscle spasms.   Yes [provider]  benazepril (LOTENSIN) 40 MG tablet Take 1 tablet by mouth daily. 08/21/15  Yes [provider]  beta carotene w/minerals (OCUVITE) tablet Take 1 tablet by mouth daily.   Yes [provider]  calcium carbonate (TUMS - DOSED IN MG ELEMENTAL CALCIUM) 500 MG chewable tablet Chew 1 tablet by mouth daily.   Yes [provider]  Carbidopa-Levodopa ER (SINEMET CR) 25-100 MG tablet controlled release Take 1 tablet by mouth 3 (three) times daily. 08/21/15  Yes [provider]  cholecalciferol (VITAMIN D3) 10 MCG (400 UNIT) TABS tablet Take 400 Units by mouth daily.   Yes [provider]  cholecalciferol (VITAMIN D3) 25 MCG (1000 UT) tablet Take 1,000 Units by mouth daily.   Yes [provider]  citalopram (CELEXA) 40 MG tablet Take 1 tablet by mouth daily. 08/21/15  Yes [provider]  clonazePAM (KLONOPIN) 0.5 MG tablet Take 1 tablet by mouth 2 (two) times daily.  08/21/15  Yes [provider]  diclofenac sodium (VOLTAREN) 1 % GEL Apply 2 g topically 2 (two) times daily.   Yes [provider]  docusate sodium (COLACE) 100 MG capsule Take 100 mg by mouth daily.   Yes [provider]  Fish Oil-Cholecalciferol (FISH OIL + D3 PO) Take 1 capsule by mouth daily.   Yes [provider]  folic acid (FOLVITE) 1 MG tablet Take 1 mg by mouth daily.   Yes [provider]  furosemide (LASIX) 20 MG tablet Take 10 tablets by mouth daily.  07/25/15  Yes [provider]  hydroxypropyl methylcellulose / hypromellose (ISOPTO TEARS / GONIOVISC) 2.5 % ophthalmic solution Place 1 drop into both eyes daily.   Yes [provider]  levothyroxine (SYNTHROID, LEVOTHROID) 88 MCG tablet Take 1 tablet by mouth daily. 08/21/15  Yes [provider]  Multiple Vitamin (MULTIVITAMIN) capsule Take 1 capsule by mouth daily.   Yes [provider]  oxybutynin (DITROPAN) 5 MG tablet Take 5 mg by mouth daily.   Yes [provider]  polyethylene glycol (MIRALAX / GLYCOLAX) packet Take 17 g by mouth daily.   Yes [provider]  rOPINIRole (REQUIP) 0.5 MG tablet Take 0.5 mg by mouth 3 (three) times daily.   Yes [provider]  meclizine (ANTIVERT) 12.5 MG tablet Take 1 tablet by mouth 3 (three) times daily as needed for dizziness.  10/15/15   [provider]  simvastatin (ZOCOR) 20 MG tablet Take 1 tablet by mouth daily. 08/21/15   [provider]    Family History History reviewed. No pertinent family history.  Social History Social History   Tobacco Use  . Smoking status: Former Research scientist (life sciences)  . Smokeless tobacco: Former Systems developer    Quit date: 09/08/1964  Substance Use Topics  . Alcohol use: No  . Drug use: No     Allergies   Sulfa antibiotics and Codeine   Review of Systems Review of Systems  Unable to perform ROS: Dementia     Physical Exam Updated Vital Signs BP (!) 127/51   Pulse 70   Temp 98.1 F (36.7 C) (Oral)   Resp 16   Ht 5\' 4"  (1.626 m)   Wt 93.4 kg   SpO2 92%   BMI 35.34 kg/m   Physical Exam  Constitutional: She is oriented to person, place, and time. She appears well-developed and well-nourished.  nad  HENT:  Head: Normocephalic and atraumatic.  Eyes: Conjunctivae are normal.  Neck: Neck supple.  Cardiovascular: Normal rate and regular rhythm.  Pulmonary/Chest: Effort normal and breath sounds normal.  Abdominal: Soft. Bowel sounds are normal.  Genitourinary:  Genitourinary Comments: Rectal exam: Firm stool in rectal vault.  Musculoskeletal: Normal range of motion.  Neurological: She is alert and oriented to person, place, and time.  Skin: Skin is warm and dry.  Psychiatric: She has a normal mood and affect. Her behavior is normal.  Nursing  note and vitals reviewed.    ED Treatments / Results  Labs (all labs ordered are listed, but only abnormal results are displayed) Labs Reviewed  CBC WITH DIFFERENTIAL/PLATELET - Abnormal; Notable for the following components:      Result Value   Hemoglobin 11.8 (*)    Neutro Abs 8.4 (*)    All other components within normal limits  COMPREHENSIVE METABOLIC PANEL - Abnormal; Notable for the following components:   Glucose, Bld 114 (*)    All other components within normal limits  LIPASE, BLOOD    EKG None  Radiology No results found.  Procedures Fecal disimpaction Date/Time: 08/09/2018 10:24 PM Performed by: Nat Christen, MD Authorized by: Nat Christen, MD  Consent: Verbal consent obtained. Consent given by: patient Patient understanding: patient states understanding of the procedure being  performed Imaging studies: imaging studies available Patient identity confirmed: verbally with patient Local anesthesia used: no  Anesthesia: Local anesthesia used: no  Sedation: Patient sedated: no  Patient tolerance: Patient tolerated the procedure well with no immediate complications    (including critical care time)  Medications Ordered in ED Medications  0.9 %  sodium chloride infusion ( Intravenous Stopped 08/06/18 1452)  ondansetron (ZOFRAN) injection 4 mg (4 mg Intravenous Given 08/06/18 1053)     Initial Impression / Assessment and Plan / ED Course  I have reviewed the triage vital signs and the nursing notes.  Pertinent labs & imaging results that were available during my care of the patient were reviewed by me and considered in my medical decision making (see chart for details).     Patient presents with diarrhea and constipation.  I suspect she is predominantly constipated with stool escaping around the fecal bolus.  I performed a digital disimpaction and Fleet enema application.  She was able to have a bowel movement.  No acute abdomen at discharge.  Final  Clinical Impressions(s) / ED Diagnoses   Final diagnoses:  Diarrhea, unspecified type    ED Discharge Orders    None       Nat Christen, MD 08/09/18 2225

## 2018-11-16 ENCOUNTER — Inpatient Hospital Stay: Admission: AD | Admit: 2018-11-16 | Payer: Self-pay | Admitting: Internal Medicine

## 2019-09-09 DEATH — deceased
